# Patient Record
Sex: Female | Born: 1985 | Race: White | Hispanic: No | Marital: Married | Smoking: Never smoker
Health system: Southern US, Community
[De-identification: ages and names within clinical notes are randomized; demographics above are authoritative.]

## PROBLEM LIST (undated history)

## (undated) DIAGNOSIS — E119 Type 2 diabetes mellitus without complications: Secondary | ICD-10-CM

## (undated) DIAGNOSIS — R87619 Unspecified abnormal cytological findings in specimens from cervix uteri: Secondary | ICD-10-CM

## (undated) DIAGNOSIS — F32A Depression, unspecified: Secondary | ICD-10-CM

## (undated) DIAGNOSIS — F319 Bipolar disorder, unspecified: Secondary | ICD-10-CM

## (undated) DIAGNOSIS — F419 Anxiety disorder, unspecified: Secondary | ICD-10-CM

## (undated) DIAGNOSIS — F988 Other specified behavioral and emotional disorders with onset usually occurring in childhood and adolescence: Secondary | ICD-10-CM

## (undated) DIAGNOSIS — F320 Major depressive disorder, single episode, mild: Secondary | ICD-10-CM

## (undated) HISTORY — DX: Anxiety disorder, unspecified: F41.9

## (undated) HISTORY — DX: Type 2 diabetes mellitus without complications: E11.9

## (undated) HISTORY — DX: Depression, unspecified: F32.A

## (undated) HISTORY — DX: Bipolar disorder, unspecified: F31.9

## (undated) HISTORY — DX: Major depressive disorder, single episode, mild: F32.0

## (undated) HISTORY — PX: COLONOSCOPY: SHX174

## (undated) HISTORY — DX: Other specified behavioral and emotional disorders with onset usually occurring in childhood and adolescence: F98.8

## (undated) HISTORY — DX: Unspecified abnormal cytological findings in specimens from cervix uteri: R87.619

## (undated) HISTORY — PX: WISDOM TOOTH EXTRACTION: SHX21

---

## 2006-01-07 LAB — CONVERTED CEMR LAB: Pap Smear: NORMAL

## 2006-02-28 ENCOUNTER — Encounter: Payer: Self-pay | Admitting: Family Medicine

## 2006-12-25 ENCOUNTER — Ambulatory Visit: Payer: Self-pay | Admitting: Family Medicine

## 2006-12-25 DIAGNOSIS — E348 Other specified endocrine disorders: Secondary | ICD-10-CM | POA: Insufficient documentation

## 2006-12-25 DIAGNOSIS — E049 Nontoxic goiter, unspecified: Secondary | ICD-10-CM | POA: Insufficient documentation

## 2006-12-25 DIAGNOSIS — R079 Chest pain, unspecified: Secondary | ICD-10-CM | POA: Insufficient documentation

## 2006-12-25 DIAGNOSIS — G43009 Migraine without aura, not intractable, without status migrainosus: Secondary | ICD-10-CM | POA: Insufficient documentation

## 2006-12-25 DIAGNOSIS — J309 Allergic rhinitis, unspecified: Secondary | ICD-10-CM | POA: Insufficient documentation

## 2006-12-25 DIAGNOSIS — E282 Polycystic ovarian syndrome: Secondary | ICD-10-CM | POA: Insufficient documentation

## 2006-12-25 DIAGNOSIS — F319 Bipolar disorder, unspecified: Secondary | ICD-10-CM | POA: Insufficient documentation

## 2006-12-25 DIAGNOSIS — F411 Generalized anxiety disorder: Secondary | ICD-10-CM | POA: Insufficient documentation

## 2006-12-25 LAB — CONVERTED CEMR LAB: TSH: 1.97 microintl units/mL (ref 0.35–5.50)

## 2007-01-11 ENCOUNTER — Ambulatory Visit: Payer: Self-pay | Admitting: Family Medicine

## 2007-03-26 ENCOUNTER — Encounter: Payer: Self-pay | Admitting: Maternal & Fetal Medicine

## 2007-04-12 ENCOUNTER — Ambulatory Visit: Payer: Self-pay | Admitting: Obstetrics and Gynecology

## 2007-06-18 ENCOUNTER — Encounter: Payer: Self-pay | Admitting: Maternal & Fetal Medicine

## 2007-07-31 ENCOUNTER — Observation Stay: Payer: Self-pay | Admitting: Obstetrics and Gynecology

## 2007-08-05 ENCOUNTER — Inpatient Hospital Stay: Payer: Self-pay | Admitting: Obstetrics and Gynecology

## 2008-04-25 ENCOUNTER — Emergency Department: Payer: Self-pay | Admitting: Emergency Medicine

## 2010-04-17 IMAGING — US ULTRAOUND OB LIMITED - NRPT [facility]
1 series · 14 of 28 positions shown · non-contrast
Comparison: none

[Series 1: ultraound ob limited - nrpt [facility] · 0.29mm/px · 14 of 43 slices shown]
[im 2/43]
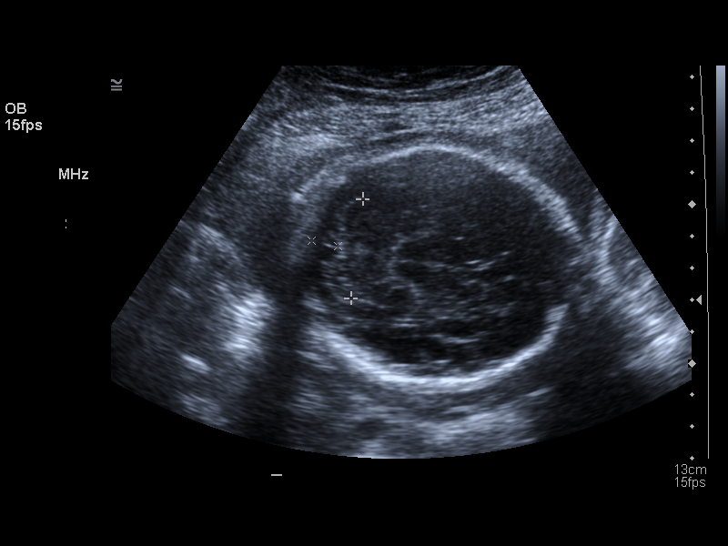
[im 5/43]
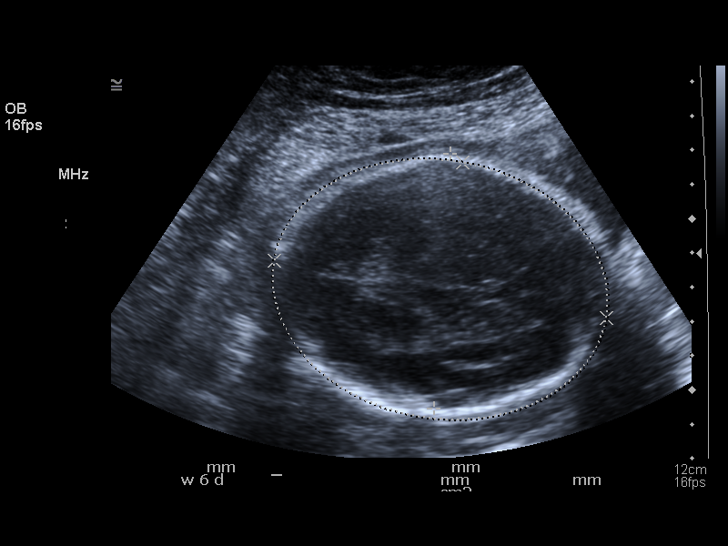
[im 8/43]
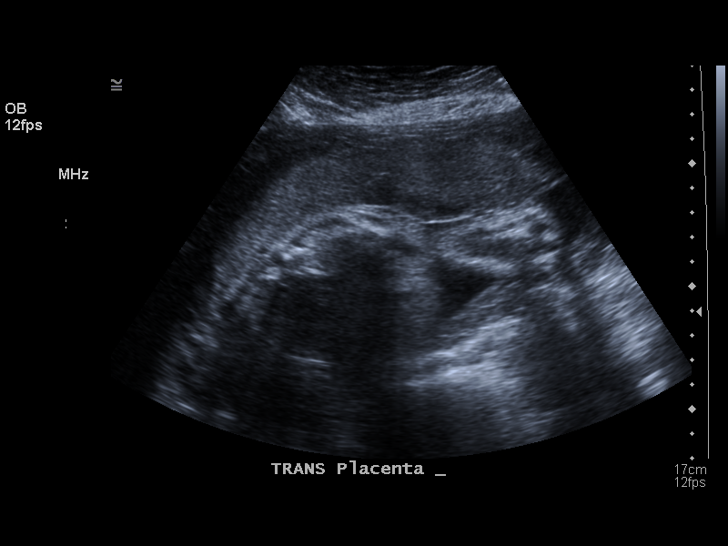
[im 11/43]
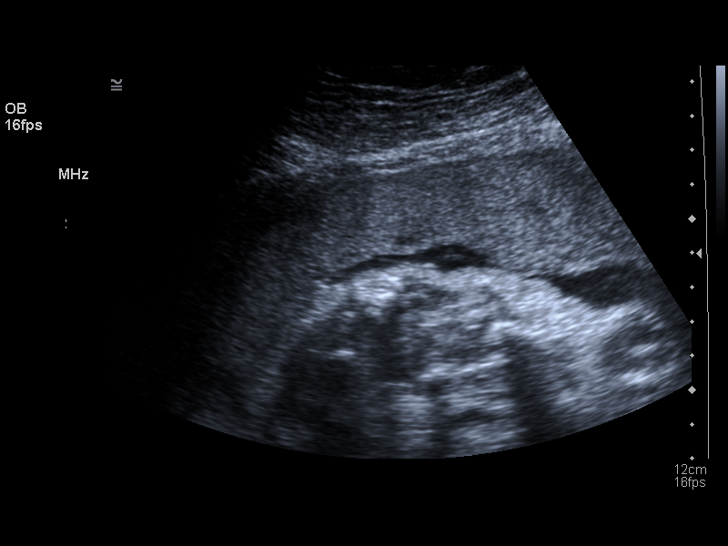
[im 15/43]
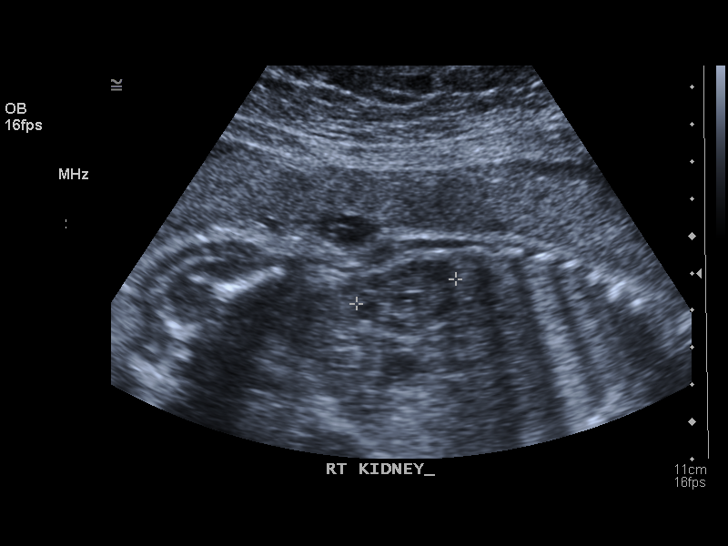
[im 18/43]
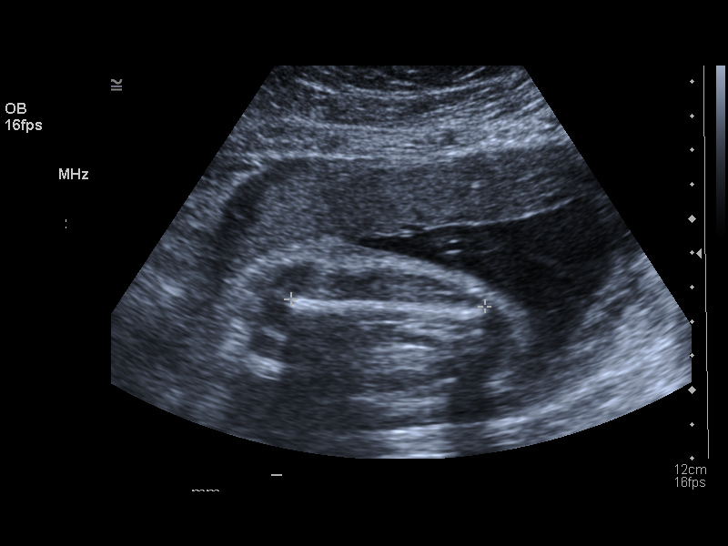
[im 21/43]
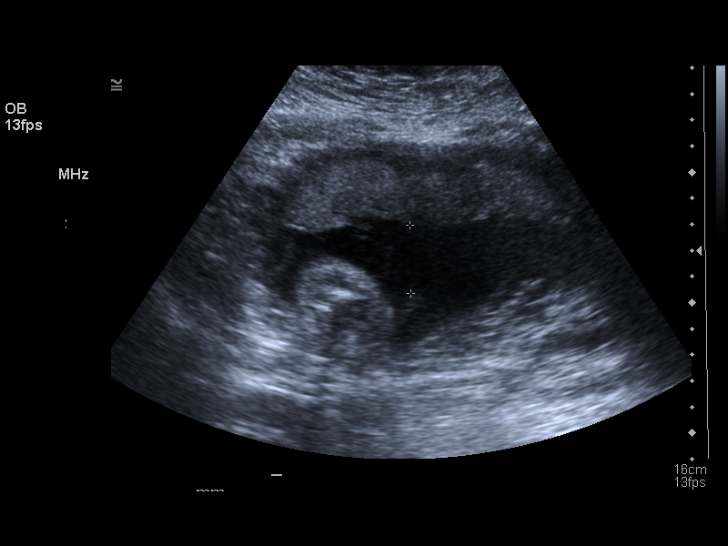
[im 24/43]
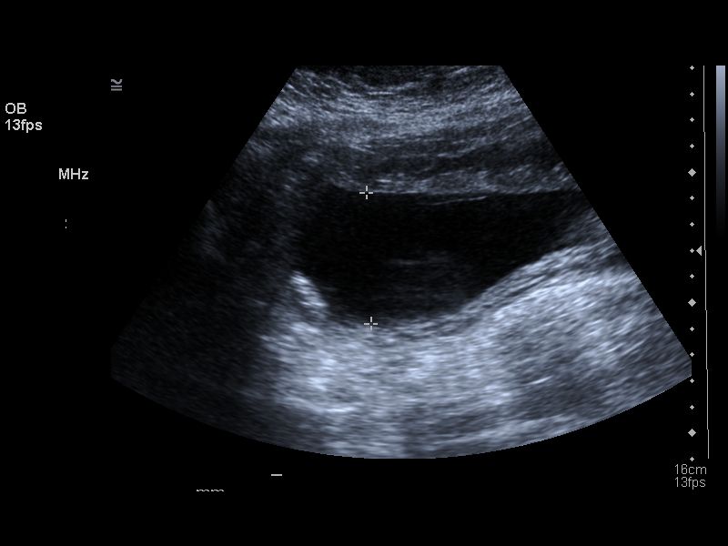
[im 27/43]
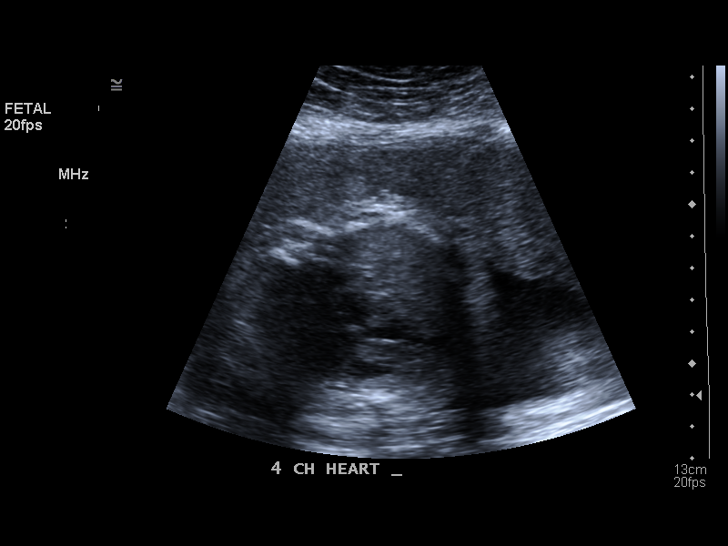
[im 30/43]
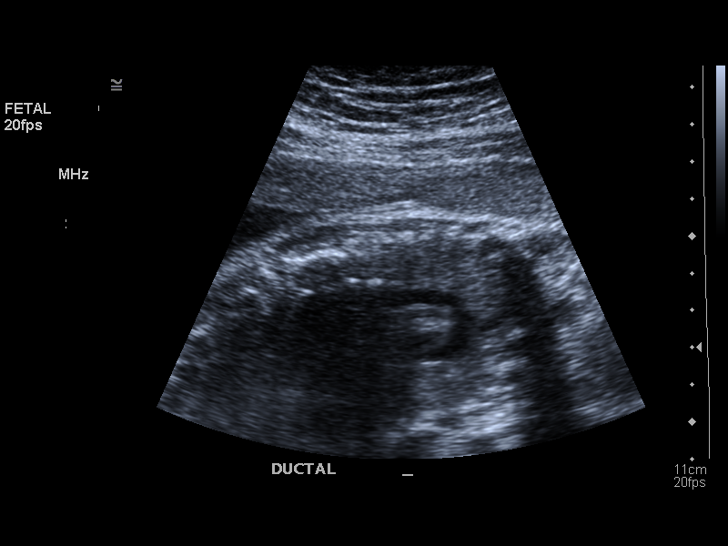
[im 33/43]
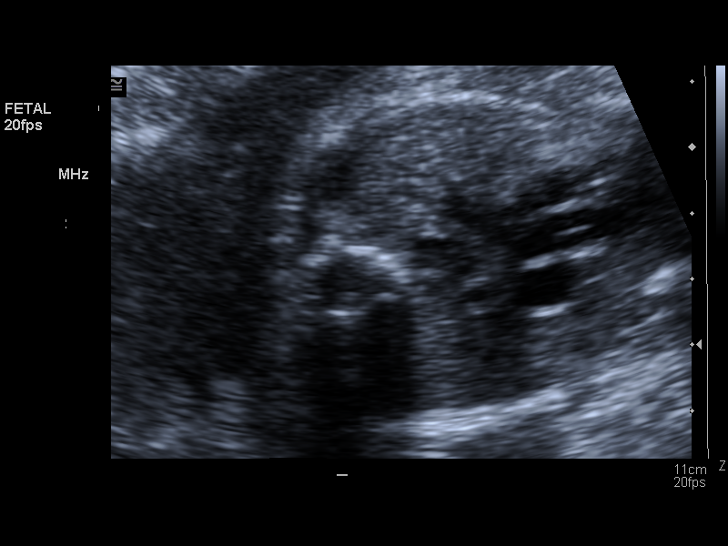
[im 36/43]
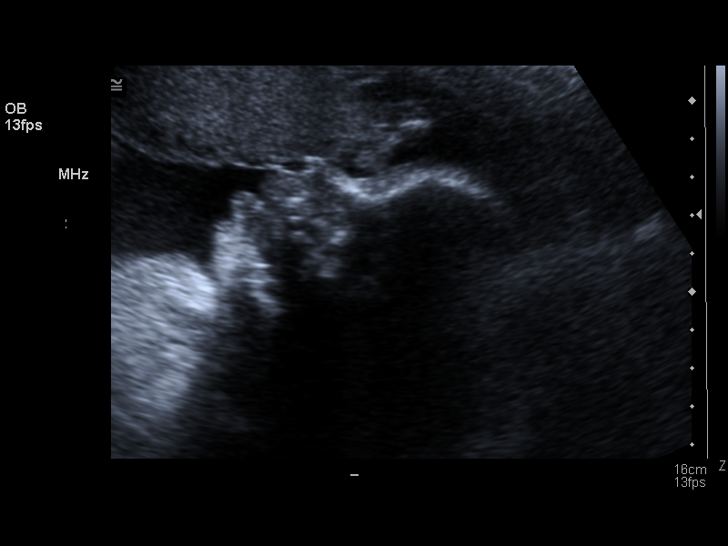
[im 39/43]
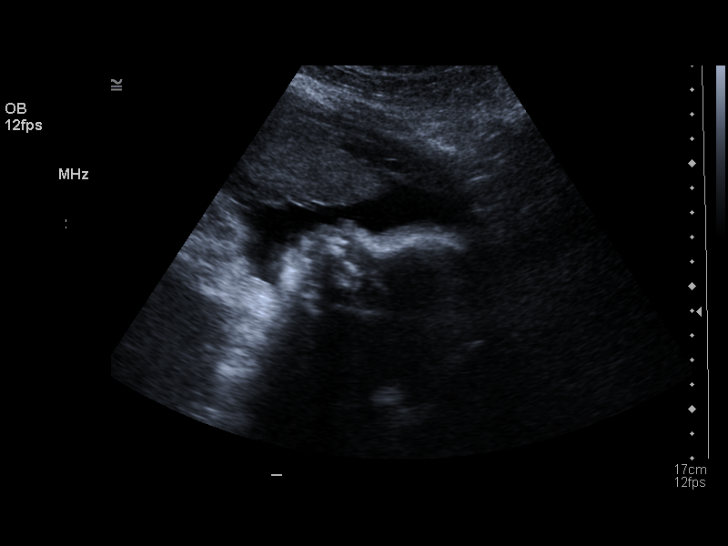
[im 43/43]
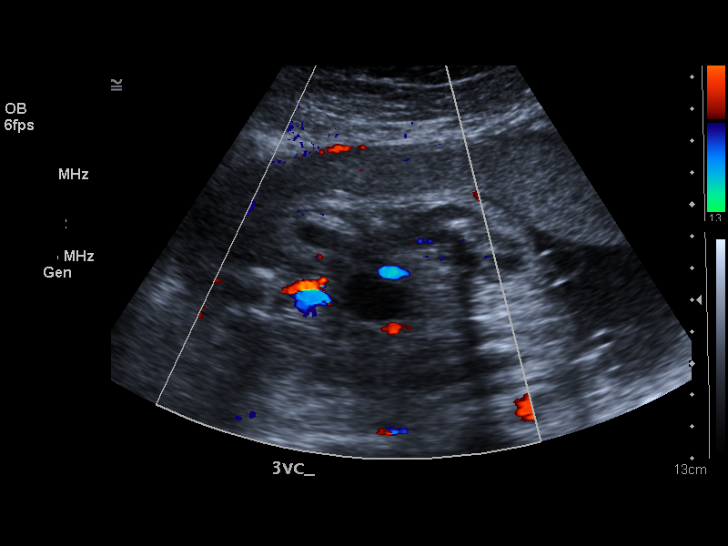

[14 of 28 positions shown; findings below may reference images not displayed]

IMAGES IMPORTED FROM THE [employer] WORKFLOW SYSTEM
NO DICTATION FOR STUDY

## 2010-06-07 IMAGING — US US OB FOLLOW-UP
1 series · 13 of 23 positions shown · non-contrast
Comparison: none

REASON FOR EXAM: fetal weight; amniotic fluid index
COMMENTS:

[Series 1: us ob follow-up · 0.35mm/px · 13 of 23 slices shown]
[im 1/23]
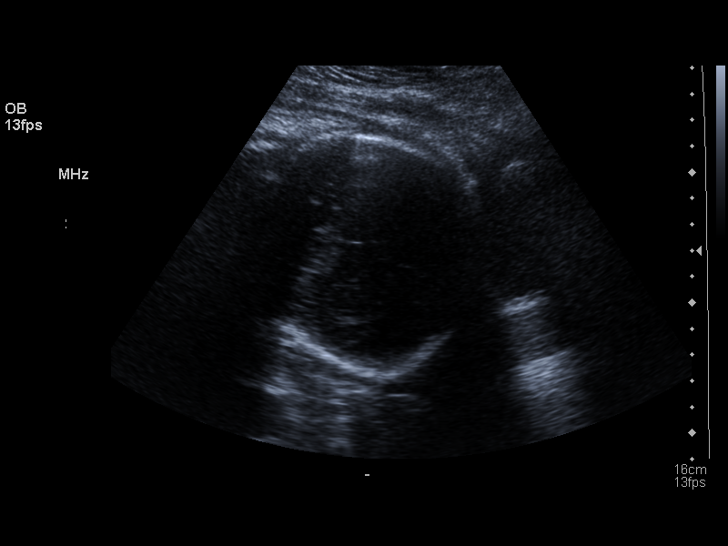
[im 3/23]
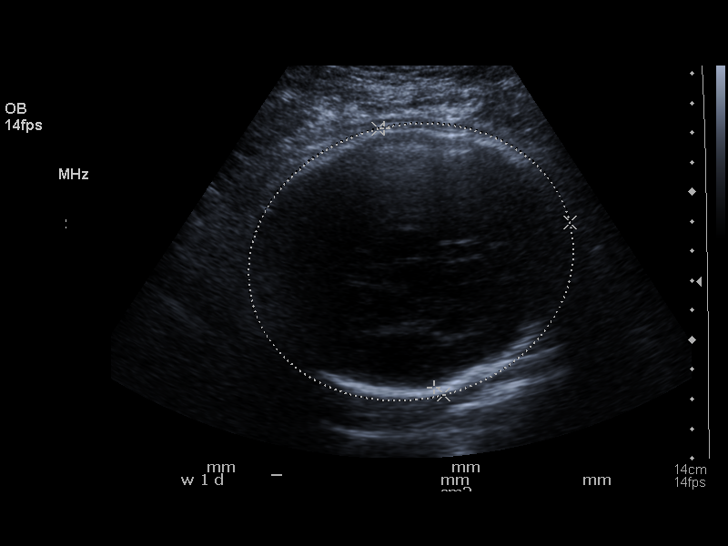
[im 5/23]
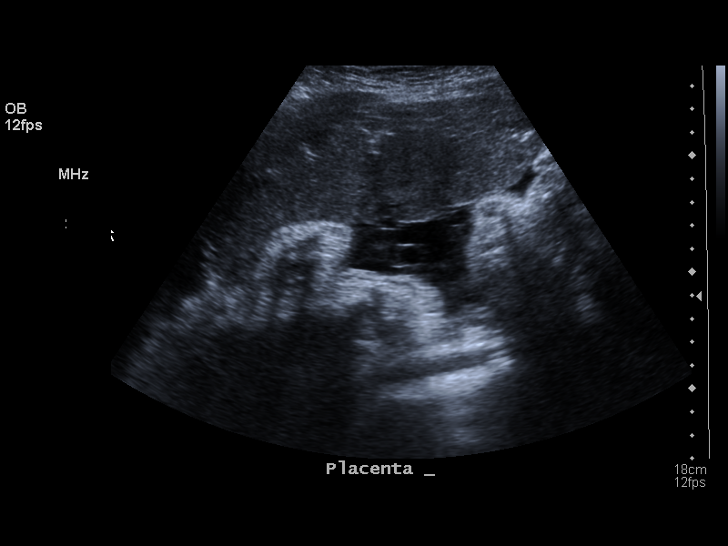
[im 7/23]
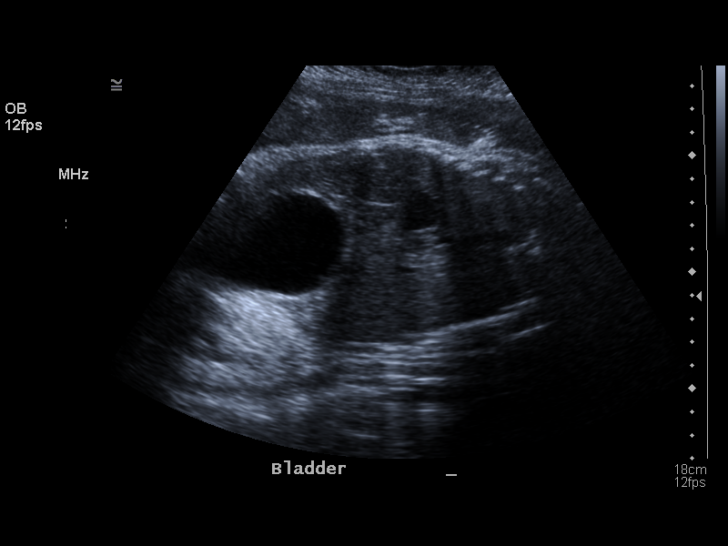
[im 8/23]
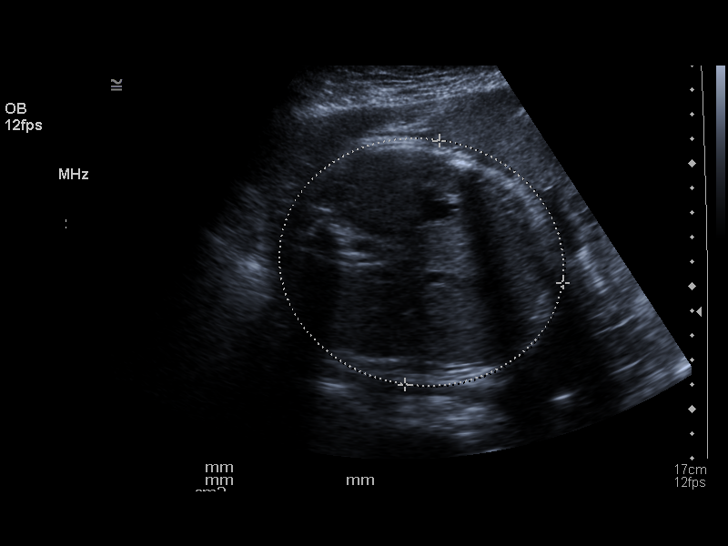
[im 10/23]
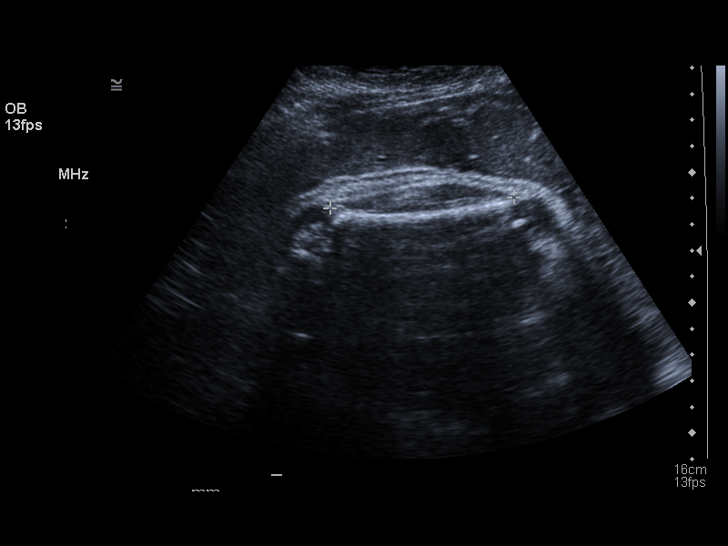
[im 12/23]
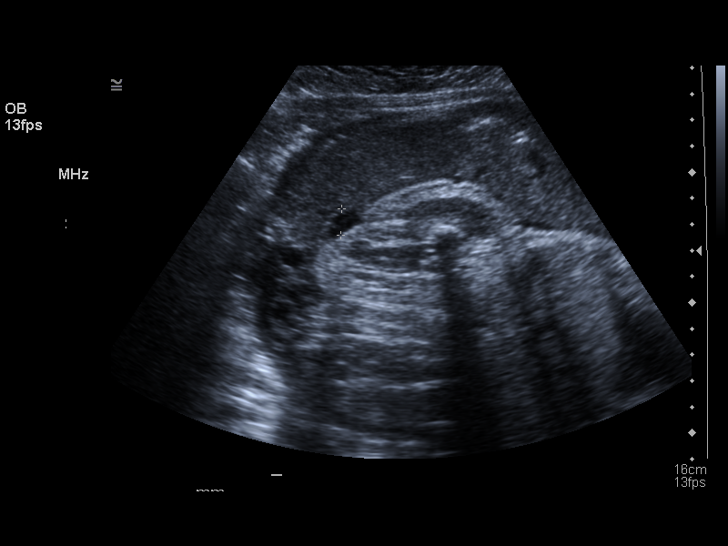
[im 14/23]
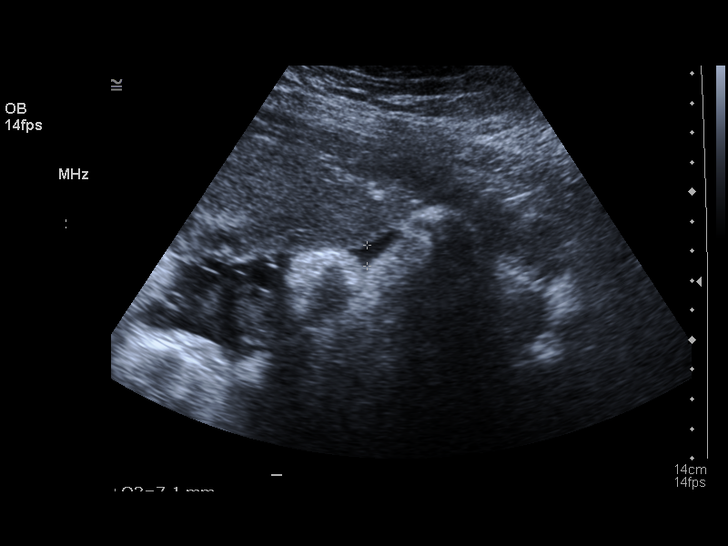
[im 16/23]
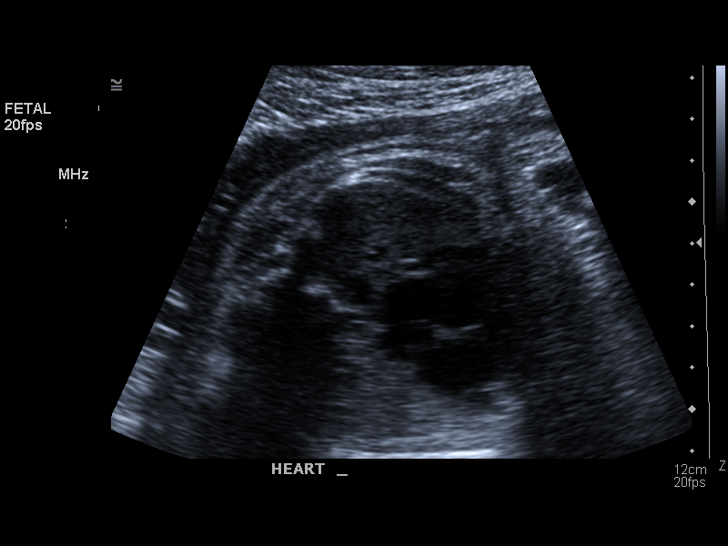
[im 17/23]
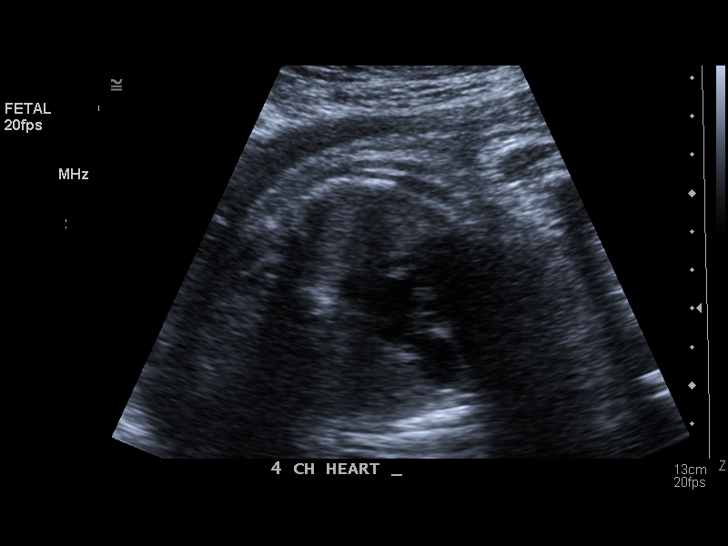
[im 19/23]
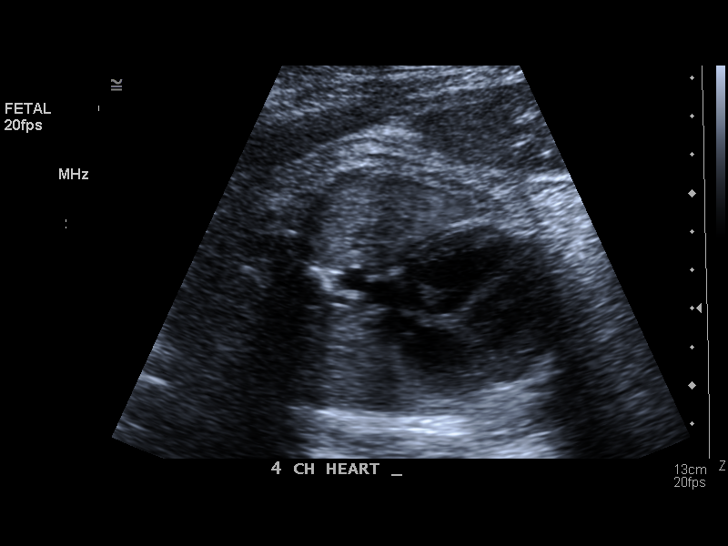
[im 21/23]
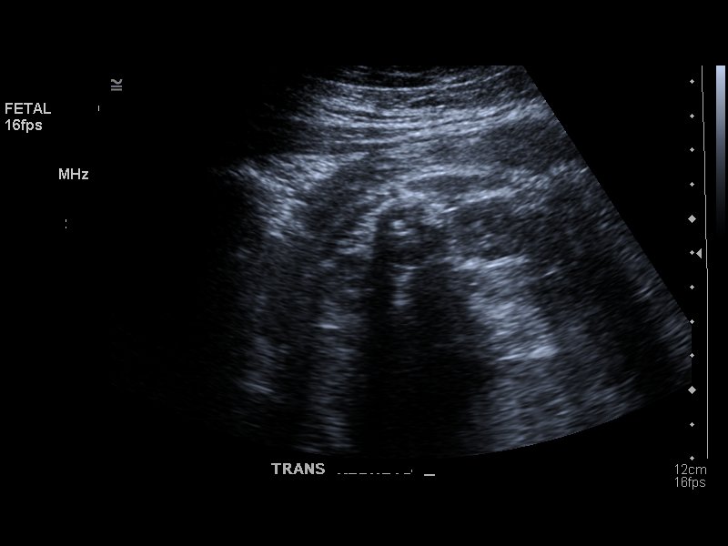
[im 23/23]
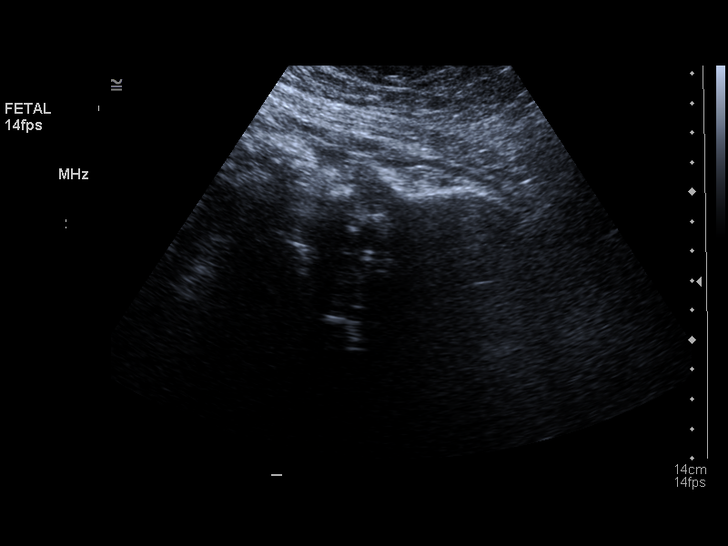

[13 of 23 positions shown; findings below may reference images not displayed]

PROCEDURE:     US  - US OB FOLLOW UP  - August 08, 2007  [DATE]

RESULT:     There is a viable IUP with a cephalic presentation. The placenta
is fundal and LEFT-sided and partially anterior. The amniotic fluid volume
is decreased with the index measured at 3.4 cm which is below the 7.0th
percentile. Cardiac activity with a rate of 128 beats per minute was
demonstrated.

The fetal kidneys and urinary bladder were demonstrated and appeared normal.
The intracranial structures and the stomach appeared normal. The spinal
structures have been evaluated on previous exam.

Measured parameters:

BPD    88.8 mm    Corresponding to an EGA of 35 weeks 6 days
HC      317.4 mm  Corresponding to an EGA of  35 weeks 5 days
AC        334 mm   Corresponding to an EGA of 37 weeks 2 days
FL          70.9 mm   Corresponding to an EGA of  36 weeks 2 days

The estimated fetal weight is 8802 grams + / - 406 grams.
IMPRESSION: There is a viable IUP with evidence of oligohydramnios. The estimated
gestational age is 36 weeks 2 days  + / - approximately 22 days. The estimated
date of confinement is 03 September, 2007. The fetal weight is 8802 grams + / - 446
grams.

## 2011-01-03 ENCOUNTER — Ambulatory Visit: Payer: Self-pay | Admitting: Otolaryngology

## 2011-01-19 ENCOUNTER — Ambulatory Visit: Payer: Self-pay | Admitting: Otolaryngology

## 2011-09-12 ENCOUNTER — Observation Stay: Payer: Self-pay

## 2011-09-12 LAB — URINALYSIS, COMPLETE
Bilirubin,UR: NEGATIVE
Leukocyte Esterase: NEGATIVE
Ph: 6 (ref 4.5–8.0)
Protein: NEGATIVE
RBC,UR: <1 /HPF (ref 0–5)
WBC UR: 1 /HPF (ref 0–5)

## 2011-09-17 ENCOUNTER — Observation Stay: Payer: Self-pay | Admitting: Obstetrics and Gynecology

## 2011-09-17 LAB — URINALYSIS, COMPLETE
Bacteria: NONE SEEN
Bilirubin,UR: NEGATIVE
Ph: 6 (ref 4.5–8.0)
Protein: NEGATIVE
RBC,UR: <1 /HPF (ref 0–5)
Specific Gravity: 1.02 (ref 1.003–1.030)
WBC UR: 1 /HPF (ref 0–5)

## 2011-09-23 ENCOUNTER — Observation Stay: Payer: Self-pay | Admitting: Obstetrics and Gynecology

## 2011-09-24 ENCOUNTER — Inpatient Hospital Stay: Payer: Self-pay | Admitting: Obstetrics and Gynecology

## 2011-09-24 LAB — CBC WITH DIFFERENTIAL/PLATELET
Basophil %: 1 %
Eosinophil #: 0 10*3/uL (ref 0.0–0.7)
Eosinophil %: 0.1 %
HCT: 33.9 % — ABNORMAL LOW (ref 35.0–47.0)
HGB: 11.5 g/dL — ABNORMAL LOW (ref 12.0–16.0)
MCH: 29.8 pg (ref 26.0–34.0)
MCHC: 33.8 g/dL (ref 32.0–36.0)
MCV: 88 fL (ref 80–100)
Monocyte #: 0.7 x10 3/mm (ref 0.2–0.9)
Neutrophil #: 12.2 10*3/uL — ABNORMAL HIGH (ref 1.4–6.5)
RBC: 3.85 10*6/uL (ref 3.80–5.20)

## 2011-09-25 LAB — HEMATOCRIT: HCT: 33 % — ABNORMAL LOW (ref 35.0–47.0)

## 2011-10-21 ENCOUNTER — Ambulatory Visit: Payer: Self-pay | Admitting: Pediatrics

## 2011-12-02 HISTORY — PX: INTRAUTERINE DEVICE INSERTION: SHX323

## 2013-08-23 ENCOUNTER — Emergency Department: Payer: Self-pay | Admitting: Emergency Medicine

## 2013-08-23 LAB — CBC WITH DIFFERENTIAL/PLATELET
BASOS ABS: 0 10*3/uL (ref 0.0–0.1)
Basophil %: 0.4 %
EOS ABS: 0.1 10*3/uL (ref 0.0–0.7)
EOS PCT: 2.4 %
HCT: 40.4 % (ref 35.0–47.0)
HGB: 13.2 g/dL (ref 12.0–16.0)
Lymphocyte #: 2.1 10*3/uL (ref 1.0–3.6)
Lymphocyte %: 38.1 %
MCH: 29.8 pg (ref 26.0–34.0)
MCHC: 32.7 g/dL (ref 32.0–36.0)
MCV: 91 fL (ref 80–100)
MONOS PCT: 6.9 %
Monocyte #: 0.4 x10 3/mm (ref 0.2–0.9)
NEUTROS ABS: 2.9 10*3/uL (ref 1.4–6.5)
NEUTROS PCT: 52.2 %
PLATELETS: 301 10*3/uL (ref 150–440)
RBC: 4.44 10*6/uL (ref 3.80–5.20)
RDW: 12.2 % (ref 11.5–14.5)
WBC: 5.5 10*3/uL (ref 3.6–11.0)

## 2013-08-23 LAB — COMPREHENSIVE METABOLIC PANEL
ALT: 31 U/L (ref 12–78)
ANION GAP: 3 — AB (ref 7–16)
Albumin: 4 g/dL (ref 3.4–5.0)
Alkaline Phosphatase: 76 U/L
BILIRUBIN TOTAL: 0.3 mg/dL (ref 0.2–1.0)
BUN: 17 mg/dL (ref 7–18)
CALCIUM: 8.3 mg/dL — AB (ref 8.5–10.1)
CHLORIDE: 111 mmol/L — AB (ref 98–107)
CO2: 27 mmol/L (ref 21–32)
CREATININE: 0.77 mg/dL (ref 0.60–1.30)
EGFR (Non-African Amer.): >60
GLUCOSE: 91 mg/dL (ref 65–99)
OSMOLALITY: 282 (ref 275–301)
Potassium: 3.9 mmol/L (ref 3.5–5.1)
SGOT(AST): 25 U/L (ref 15–37)
Sodium: 141 mmol/L (ref 136–145)
Total Protein: 7.3 g/dL (ref 6.4–8.2)

## 2013-08-23 LAB — URINALYSIS, COMPLETE
BLOOD: NEGATIVE
Bacteria: NONE SEEN
Bilirubin,UR: NEGATIVE
Glucose,UR: NEGATIVE mg/dL (ref 0–75)
KETONE: NEGATIVE
NITRITE: NEGATIVE
PROTEIN: NEGATIVE
Ph: 6 (ref 4.5–8.0)
Specific Gravity: 1.018 (ref 1.003–1.030)
WBC UR: 4 /HPF (ref 0–5)

## 2013-08-23 LAB — LIPASE, BLOOD: Lipase: 198 U/L (ref 73–393)

## 2014-06-17 NOTE — H&P (Signed)
L&D Evaluation:  History:   HPI 29 yo G3P1001 @ 35.6wks EDC 10/16/11 by 5wk uls presents with contractions.  They started last night.  She called and reported them as q 5 min and was told to come in for evaluation.  She has not had any cervical change since arrival.  [facility] ?type II DM for which she is not on any meds and 3hr glucola WNL, anxiety/depression.  Urine WNL except tr ketones.    Presents with contractions    Patient's Medical History Anxiety/depression, ?type II DM    Patient's Surgical History Wisdom teeth    Medications Zoloft    Allergies NKDA    Social History none    Family History Anxiety, Bipolar, HTN, Depression   ROS:   ROS see HPI   Exam:   Vital Signs stable    Urine Protein neg UA    General no apparent distress    Mental Status clear    Chest clear    Heart normal sinus rhythm    Abdomen gravid, non-tender    Edema no edema    Pelvic 3-4cm per RN    Mebranes Intact    FHT normal rate with no decels    Fetal Heart Rate 125    Ucx regular, q 2 min    Skin dry   Impression:   Impression Threatened PTL   Plan:   Plan EFM/NST, monitor contractions and for cervical change, fluids    Comments Consider terbutaline.   Electronic Signatures: Senaida LangeWeaver-Lee, Mimie Goering (MD)  (Signed 10-Aug-13 08:23)  Authored: L&D Evaluation   Last Updated: 10-Aug-13 08:23 by Senaida LangeWeaver-Lee, Naureen Benton (MD)

## 2014-06-17 NOTE — H&P (Signed)
L&D Evaluation:  History:   HPI 29 yo G3P1011 [facility]d [facility] by 5wk uls presents with contractions.  They started yesterday evening and have been increasing in intensity, noted some spotting, good fetal movement, no LOF.  She was seen 1 week ago for similar concerns and was noted to be 4cm dilated with no cervical change and was discharged home.  [facility] @ [facility] complicated by ?type II DM for which she is not on any meds and 3hr glucola WNL, anxiety/depression.    Presents with contractions    Patient's Medical History Anxiety/depression, ?type II DM    Patient's Surgical History Wisdom teeth    Medications Zoloft    Allergies NKDA    Social History none    Family History Anxiety, Bipolar, HTN, Depression   ROS:   ROS see HPI   Exam:   Vital Signs stable    Urine Protein not completed    General no apparent distress    Mental Status clear    Chest clear    Heart normal sinus rhythm    Abdomen gravid, non-tender    Estimated Fetal Weight Average for gestational age    Fetal Position vtx    Edema no edema    Pelvic no external lesions, 4/70/-3    Mebranes Intact    FHT normal rate with no decels    Fetal Heart Rate 125    Ucx regular, q 2-5 min    Skin dry   Impression:   Impression R/O labor   Plan:   Plan EFM/NST, monitor contractions and for cervical change, fluids    Comments - po fluids - recheck in 2-hrs   Electronic Signatures: Lorrene ReidStaebler, Shron Ozer M (MD)  (Signed 17-Aug-13 02:11)  Authored: L&D Evaluation   Last Updated: 17-Aug-13 02:11 by Lorrene ReidStaebler, Elanna Bert M (MD)

## 2014-06-17 NOTE — H&P (Signed)
L&D Evaluation:  History:   HPI 29 yo G3P1001 [facility]d [facility] by 5wk uls represents with contractions.  They started yesterday evening and have been increasing in intensity, noted some spotting, good fetal movement, no LOF.  She was seen earlier today for similar symptoms, was noted to be unchanged over 3hrs and was given option of discharge home vs staying for morphine sleep.  The patient was given two norco tabs and discharged home.  She represents with continued contractions.    Presents with contractions    Patient's Medical History Anxiety/depression, ?type II DM    Patient's Surgical History Wisdom teeth    Medications Zoloft    Allergies PCN    Social History none    Family History Anxiety, Bipolar, HTN, Depression   ROS:   ROS see HPI   Exam:   Vital Signs stable    Urine Protein not completed    General no apparent distress    Mental Status clear    Chest clear    Heart normal sinus rhythm    Abdomen gravid, non-tender    Estimated Fetal Weight Average for gestational age    Fetal Position vtx    Edema no edema    Pelvic no external lesions, 4/70/-3    Mebranes Intact    FHT normal rate with no decels    Fetal Heart Rate 125    Ucx regular, q 5 min    Skin dry   Impression:   Impression R/O labor   Plan:   Plan EFM/NST, monitor contractions and for cervical change, fluids    Comments - Cervix unchanged, reassuring fetal survillance on NST - Will morphine sleep  and IV hydrate overnight and recheck in AM, discussed if no cervical change will discharge home unable to induce prior to 39 weeks.   Electronic Signatures: Lorrene ReidStaebler, Trevone Prestwood M (MD)  (Signed [phone]-Aug-13 20:56)  Authored: L&D Evaluation   Last Updated: 16-Aug-13 20:56 by Lorrene ReidStaebler, Bernis Stecher M (MD)

## 2016-02-29 HISTORY — PX: IUD REMOVAL: SHX5392

## 2017-04-20 ENCOUNTER — Encounter: Payer: Self-pay | Admitting: Obstetrics and Gynecology

## 2017-04-21 ENCOUNTER — Ambulatory Visit: Payer: Self-pay | Admitting: Obstetrics and Gynecology

## 2017-04-28 ENCOUNTER — Ambulatory Visit (INDEPENDENT_AMBULATORY_CARE_PROVIDER_SITE_OTHER): Payer: BLUE CROSS/BLUE SHIELD | Admitting: Obstetrics and Gynecology

## 2017-04-28 ENCOUNTER — Encounter: Payer: Self-pay | Admitting: Obstetrics and Gynecology

## 2017-04-28 DIAGNOSIS — Z1231 Encounter for screening mammogram for malignant neoplasm of breast: Secondary | ICD-10-CM

## 2017-04-28 DIAGNOSIS — Z1239 Encounter for other screening for malignant neoplasm of breast: Secondary | ICD-10-CM

## 2017-04-28 DIAGNOSIS — Z01419 Encounter for gynecological examination (general) (routine) without abnormal findings: Secondary | ICD-10-CM

## 2017-04-28 DIAGNOSIS — N946 Dysmenorrhea, unspecified: Secondary | ICD-10-CM

## 2017-04-28 MED ORDER — MEFENAMIC ACID 250 MG PO CAPS: ORAL_CAPSULE | ORAL | 13 refills | Status: DC

## 2017-04-28 NOTE — Patient Instructions (Signed)
Karie Mainland or [employer]   FAST FACTS . Body Mass Index (BMI) is one measurement that your doctor may use to discuss your weight . BMI is an estimate of body fat. Individuals with a BMI of 25.0-29.9 are considered overweight. Those with a BMI above 30.0 are considered obese . Obesity is a risk factor for many cancers, especially endometrial cancer. In fact, if you are obese, your risk for endometrial cancer may be 10 times higher. . Obesity may affect how your cancer is treated (surgery, chemotherapy, and/or radiation). . If you are overweight or obese, ask your doctor for information about diet and exercise programs.  EXERCISE Here is a list of resources for exercise recommendations and programs that can help you get started. Be sure to look for exercise programs and classes in your neighborhood to get personal support.  American Cancer Society (ACS): Eat Healthy and Get Active www.cancer.org/healthy/eathealthygetactive/ The site provides details about the importance of exercise in cancer prevention as well as resources providing exercise guidelines and tools to set goals and manage physical activity  American Council on Exercise (ACE): Get Fit www.acefitness.org/acefit  This site is full of fitness programs including personalized training workouts and a Engineering geologist of exercise programs. Links to local exercise trainers are provided.  American Heart Association: Getting Healthy - Physical Activity [id number] This site provides the American Heart Association guidelines for physical activity, tips for getting started and tips for long term success.   Calorie Counting for Weight Loss Calories are units of energy. Your body needs a certain amount of calories from food to keep you going throughout the day. When you eat more calories than your body needs, your body stores the extra calories as fat. When you eat fewer  calories than your body needs, your body burns fat to get the energy it needs. Calorie counting means keeping track of how many calories you eat and drink each day. Calorie counting can be helpful if you need to lose weight. If you make sure to eat fewer calories than your body needs, you should lose weight. Ask your health care provider what a healthy weight is for you. For calorie counting to work, you will need to eat the right number of calories in a day in order to lose a healthy amount of weight per week. A dietitian can help you determine how many calories you need in a day and will give you suggestions on how to reach your calorie goal.  A healthy amount of weight to lose per week is usually 1-2 lb (0.5-0.9 kg). This usually means that your daily calorie intake should be reduced by 500-750 calories.  Eating 1,200 - 1,500 calories per day can help most women lose weight.  Eating 1,500 - 1,800 calories per day can help most men lose weight.  What is my plan? My goal is to have __________ calories per day. If I have this many calories per day, I should lose around __________ pounds per week. What do I need to know about calorie counting? In order to meet your daily calorie goal, you will need to:  Find out how many calories are in each food you would like to eat. Try to do this before you eat.  Decide how much of the food you plan to eat.  Write down what you ate and how many calories it had. Doing this is called keeping a food log.  To successfully lose weight, it is important to balance calorie counting with a healthy lifestyle  that includes regular activity. Aim for 150 minutes of moderate exercise (such as walking) or 75 minutes of vigorous exercise (such as running) each week. Where do I find calorie information?  The number of calories in a food can be found on a Nutrition Facts label. If a food does not have a Nutrition Facts label, try to look up the calories online or ask your  dietitian for help. Remember that calories are listed per serving. If you choose to have more than one serving of a food, you will have to multiply the calories per serving by the amount of servings you plan to eat. For example, the label on a package of bread might say that a serving size is 1 slice and that there are 90 calories in a serving. If you eat 1 slice, you will have eaten 90 calories. If you eat 2 slices, you will have eaten 180 calories. How do I keep a food log? Immediately after each meal, record the following information in your food log:  What you ate. Don't forget to include toppings, sauces, and other extras on the food.  How much you ate. This can be measured in cups, ounces, or number of items.  How many calories each food and drink had.  The total number of calories in the meal.  Keep your food log near you, such as in a small notebook in your pocket, or use a mobile app or website. Some programs will calculate calories for you and show you how many calories you have left for the day to meet your goal. What are some calorie counting tips?  Use your calories on foods and drinks that will fill you up and not leave you hungry: ? Some examples of foods that fill you up are nuts and nut butters, vegetables, lean proteins, and high-fiber foods like whole grains. High-fiber foods are foods with more than 5 g fiber per serving. ? Drinks such as sodas, specialty coffee drinks, alcohol, and juices have a lot of calories, yet do not fill you up.  Eat nutritious foods and avoid empty calories. Empty calories are calories you get from foods or beverages that do not have many vitamins or protein, such as candy, sweets, and soda. It is better to have a nutritious high-calorie food (such as an avocado) than a food with few nutrients (such as a bag of chips).  Know how many calories are in the foods you eat most often. This will help you calculate calorie counts faster.  Pay attention to  calories in drinks. Low-calorie drinks include water and unsweetened drinks.  Pay attention to nutrition labels for "low fat" or "fat free" foods. These foods sometimes have the same amount of calories or more calories than the full fat versions. They also often have added sugar, starch, or salt, to make up for flavor that was removed with the fat.  Find a way of tracking calories that works for you. Get creative. Try different apps or programs if writing down calories does not work for you. What are some portion control tips?  Know how many calories are in a serving. This will help you know how many servings of a certain food you can have.  Use a measuring cup to measure serving sizes. You could also try weighing out portions on a kitchen scale. With time, you will be able to estimate serving sizes for some foods.  Take some time to put servings of different foods on your favorite plates,  bowls, and cups so you know what a serving looks like.  Try not to eat straight from a bag or box. Doing this can lead to overeating. Put the amount you would like to eat in a cup or on a plate to make sure you are eating the right portion.  Use smaller plates, glasses, and bowls to prevent overeating.  Try not to multitask (for example, watch TV or use your computer) while eating. If it is time to eat, sit down at a table and enjoy your food. This will help you to know when you are full. It will also help you to be aware of what you are eating and how much you are eating. What are tips for following this plan? Reading food labels  Check the calorie count compared to the serving size. The serving size may be smaller than what you are used to eating.  Check the source of the calories. Make sure the food you are eating is high in vitamins and protein and low in saturated and trans fats. Shopping  Read nutrition labels while you shop. This will help you make healthy decisions before you decide to purchase  your food.  Make a grocery list and stick to it. Cooking  Try to cook your favorite foods in a healthier way. For example, try baking instead of frying.  Use low-fat dairy products. Meal planning  Use more fruits and vegetables. Half of your plate should be fruits and vegetables.  Include lean proteins like poultry and fish. How do I count calories when eating out?  Ask for smaller portion sizes.  Consider sharing an entree and sides instead of getting your own entree.  If you get your own entree, eat only half. Ask for a box at the beginning of your meal and put the rest of your entree in it so you are not tempted to eat it.  If calories are listed on the menu, choose the lower calorie options.  Choose dishes that include vegetables, fruits, whole grains, low-fat dairy products, and lean protein.  Choose items that are boiled, broiled, grilled, or steamed. Stay away from items that are buttered, battered, fried, or served with cream sauce. Items labeled "crispy" are usually fried, unless stated otherwise.  Choose water, low-fat milk, unsweetened iced tea, or other drinks without added sugar. If you want an alcoholic beverage, choose a lower calorie option such as a glass of wine or light beer.  Ask for dressings, sauces, and syrups on the side. These are usually high in calories, so you should limit the amount you eat.  If you want a salad, choose a garden salad and ask for grilled meats. Avoid extra toppings like bacon, cheese, or fried items. Ask for the dressing on the side, or ask for olive oil and vinegar or lemon to use as dressing.  Estimate how many servings of a food you are given. For example, a serving of cooked rice is  cup or about the size of half a baseball. Knowing serving sizes will help you be aware of how much food you are eating at restaurants. The list below tells you how big or small some common portion sizes are based on everyday objects: ? 1 oz-4 stacked  dice. ? 3 oz-1 deck of cards. ? 1 tsp-1 die. ? 1 Tbsp- a ping-pong ball. ? 2 Tbsp-1 ping-pong ball. ?  cup- baseball. ? 1 cup-1 baseball. Summary  Calorie counting means keeping track of how many calories you  eat and drink each day. If you eat fewer calories than your body needs, you should lose weight.  A healthy amount of weight to lose per week is usually 1-2 lb (0.5-0.9 kg). This usually means reducing your daily calorie intake by 500-750 calories.  The number of calories in a food can be found on a Nutrition Facts label. If a food does not have a Nutrition Facts label, try to look up the calories online or ask your dietitian for help.  Use your calories on foods and drinks that will fill you up, and not on foods and drinks that will leave you hungry.  Use smaller plates, glasses, and bowls to prevent overeating. This information is not intended to replace advice given to you by your health care provider. Make sure you discuss any questions you have with your health care provider. Document Released: 01/24/2005 Document Revised: 12/25/2015 Document Reviewed: 12/25/2015 Elsevier Interactive Patient Education  [employer].

## 2017-04-28 NOTE — Progress Notes (Signed)
Patient ID: Marissa Holmes, female   DOB: Jan 07, 1986, 32 y.o.   MRN: [id number]       Gynecology Annual Exam   PCP: Excell Seltzer, MD  Chief Complaint:  Chief Complaint  Patient presents with  . Gynecologic Exam    weight gain on medication    History of Present Illness: Patient is a 32 y.o. [id number] presents for annual exam. The patient has no complaints today.   LMP: Patient's last menstrual period was 04/15/2017 (exact date). Average Interval: regular, 28 days Duration of flow: 5 days Heavy Menses: no Clots: no Intermenstrual Bleeding: no Postcoital Bleeding: no Dysmenorrhea: yes  The patient is sexually active. She currently uses condoms for contraception. She denies dyspareunia.  The patient does perform self breast exams.  There is no notable family history of breast or ovarian cancer in her family.  The patient wears seatbelts: yes.   The patient has regular exercise: yes.    The patient reports current symptoms of depression. Currently managed by psychiatry.  She was started on luteal phase Zoloft for PMDD.   Review of Systems: Review of Systems  Constitutional: Negative for chills and fever.  HENT: Negative for congestion.   Respiratory: Negative for cough and shortness of breath.   Cardiovascular: Negative for chest pain and palpitations.  Gastrointestinal: Negative for abdominal pain, constipation, diarrhea, heartburn, nausea and vomiting.  Genitourinary: Negative for dysuria, frequency and urgency.  Skin: Negative for itching and rash.  Neurological: Negative for dizziness and headaches.  Endo/Heme/Allergies: Negative for polydipsia.  Psychiatric/Behavioral: Negative for depression.    Past Medical History:  Past Medical History:  Diagnosis Date  . Abnormal Pap smear of cervix   . Anxiety   . Attention deficit disorder (ADD)   . Bipolar 1 disorder (HCC)   . Mild depression (HCC)   . Type 2 diabetes mellitus (HCC)     Past Surgical History:    Past Surgical History:  Procedure Laterality Date  . COLONOSCOPY    . INTRAUTERINE DEVICE INSERTION  12/02/2011  . IUD REMOVAL  02/29/2016  . WISDOM TOOTH EXTRACTION      Gynecologic History:  Patient's last menstrual period was 04/15/2017 (exact date). Contraception: condoms Last Pap: Results were:02/29/2016 NIL and HR HPV+   Obstetric History: [id number]  Family History:  Family History  Problem Relation Age of Onset  . Colon cancer Paternal Grandmother 59  . Hypertension Paternal Grandmother   . Anxiety disorder Mother   . Depression Mother   . Hypertension Father   . Depression Father   . Bipolar disorder Maternal Grandmother   . Depression Maternal Grandmother   . Melanoma Maternal Grandfather   . Prostate cancer Maternal Grandfather   . Melanoma Paternal Grandfather     Social History:  Social History   Socioeconomic History  . Marital status: Single    Spouse name: Not on file  . Number of children: Not on file  . Years of education: Not on file  . Highest education level: Not on file  Occupational History  . Not on file  Social Needs  . Financial resource strain: Not on file  . Food insecurity:    Worry: Not on file    Inability: Not on file  . Transportation needs:    Medical: Not on file    Non-medical: Not on file  Tobacco Use  . Smoking status: Never Smoker  . Smokeless tobacco: Never Used  Substance and Sexual Activity  . Alcohol use: Yes  Frequency: Never  . Drug use: Never  . Sexual activity: Yes  Lifestyle  . Physical activity:    Days per week: Not on file    Minutes per session: Not on file  . Stress: Not on file  Relationships  . Social connections:    Talks on phone: Not on file    Gets together: Not on file    Attends religious service: Not on file    Active member of club or organization: Not on file    Attends meetings of clubs or organizations: Not on file    Relationship status: Not on file  . Intimate partner violence:     Fear of current or ex partner: Not on file    Emotionally abused: Not on file    Physically abused: Not on file    Forced sexual activity: Not on file  Other Topics Concern  . Not on file  Social History Narrative  . Not on file    Allergies:  No Known Allergies  Medications: Prior to Admission medications   Medication Sig Start Date End Date Taking? Authorizing Provider  ALPRAZolam Prudy Feeler) 0.25 MG tablet  01/13/14  Yes [provider]  Carbamazepine (EQUETRO) 300 MG CP12  06/20/13  Yes [provider]  clonazePAM (KLONOPIN) 0.5 MG tablet  03/12/14  Yes [provider]  lamoTRIgine (LAMICTAL) 100 MG tablet  01/21/14  Yes [provider]  lisdexamfetamine (VYVANSE) 40 MG capsule Take by mouth.   Yes [provider]  Multiple Vitamin (THERA) TABS Take by mouth.   Yes [provider]  sertraline (ZOLOFT) 50 MG tablet TK 1 T PO QD 01/22/17  Yes [provider]  SUMAtriptan (IMITREX) 50 MG tablet TAKE 1 TABLET BY MOUTH AS NEEDED FOR MIGRAINE, MAY REPEAT DOSE AFTER 2 HOURS AS NEEDED 08/02/16  Yes [provider]  topiramate (TOPAMAX) 50 MG tablet TAKE 1 TABLET(50 MG) BY MOUTH EVERY DAY 05/31/16  Yes [provider]  traZODone (DESYREL) 150 MG tablet  03/21/17  Yes [provider]  Mefenamic Acid (PONSTEL) 250 MG CAPS 2 capsules (500 mg) po once and the onset of menses, then 1 capsule (250 mg) every 6 hours prn cramping max 3 days 04/28/17   Vena Austria, MD    Physical Exam Vitals: Blood pressure 124/72, pulse 82, height 5' 1.5" (1.562 m), weight 150 lb (68 kg), last menstrual period 04/15/2017.  General: NAD HEENT: normocephalic, anicteric Thyroid: no enlargement, no palpable nodules Pulmonary: No increased work of breathing, CTAB Cardiovascular: RRR, distal pulses 2+ Breast: Breast symmetrical, no tenderness, no palpable nodules or masses, no skin or nipple retraction present, no nipple  discharge.  No axillary or supraclavicular lymphadenopathy. Abdomen: NABS, soft, non-tender, non-distended.  Umbilicus without lesions.  No hepatomegaly, splenomegaly or masses palpable. No evidence of hernia  Genitourinary:  External: Normal external female genitalia.  Normal urethral meatus, normal Bartholin's and Skene's glands.    Vagina: Normal vaginal mucosa, no evidence of prolapse.    Cervix: Grossly normal in appearance, no bleeding  Uterus: Non-enlarged, mobile, normal contour.  No CMT  Adnexa: ovaries non-enlarged, no adnexal masses  Rectal: deferred  Lymphatic: no evidence of inguinal lymphadenopathy Extremities: no edema, erythema, or tenderness Neurologic: Grossly intact Psychiatric: mood appropriate, affect full  Female chaperone present for pelvic and breast  portions of the physical exam    Assessment: 32 y.o. [id number] routine annual exam  Plan: Problem List Items Addressed This Visit    None  Visit Diagnoses    Breast screening       Encounter for gynecological examination without abnormal finding       Dysmenorrhea          2) STI screening  was notoffered and therefore not obtained  2)  ASCCP guidelines and rational discussed.  Patient opts for every 3 years screening interval  3) Contraception - the patient is currently using  condoms.  She is happy with her current form of contraception and plans to continue  - add ponstel for dysmenorrhea  4) Routine healthcare maintenance including cholesterol, diabetes screening discussed managed by PCP  5) Return in about 1 year (around 04/29/2018) for annual.   Vena AustriaAndreas Tija Biss, MD, Merlinda FrederickFACOG [facility], [facility] 04/28/2017, 7:43 PM

## 2017-06-13 ENCOUNTER — Encounter: Payer: Self-pay | Admitting: Obstetrics and Gynecology

## 2017-07-18 ENCOUNTER — Encounter: Payer: Self-pay | Admitting: Obstetrics and Gynecology

## 2017-10-31 DIAGNOSIS — F411 Generalized anxiety disorder: Secondary | ICD-10-CM | POA: Insufficient documentation

## 2017-11-22 ENCOUNTER — Other Ambulatory Visit: Payer: Self-pay | Admitting: Psychiatry

## 2017-11-27 ENCOUNTER — Encounter: Payer: Self-pay | Admitting: Emergency Medicine

## 2017-12-06 ENCOUNTER — Ambulatory Visit: Payer: BLUE CROSS/BLUE SHIELD | Admitting: Psychiatry

## 2017-12-06 ENCOUNTER — Encounter: Payer: Self-pay | Admitting: Psychiatry

## 2017-12-06 DIAGNOSIS — F411 Generalized anxiety disorder: Secondary | ICD-10-CM | POA: Diagnosis not present

## 2017-12-06 DIAGNOSIS — F4010 Social phobia, unspecified: Secondary | ICD-10-CM

## 2017-12-06 DIAGNOSIS — F4001 Agoraphobia with panic disorder: Secondary | ICD-10-CM | POA: Diagnosis not present

## 2017-12-06 DIAGNOSIS — F3181 Bipolar II disorder: Secondary | ICD-10-CM

## 2017-12-06 DIAGNOSIS — F900 Attention-deficit hyperactivity disorder, predominantly inattentive type: Secondary | ICD-10-CM

## 2017-12-06 MED ORDER — LISDEXAMFETAMINE DIMESYLATE 50 MG PO CAPS: 50.0000 mg | ORAL_CAPSULE | ORAL | 0 refills | Status: DC

## 2017-12-06 MED ORDER — LISDEXAMFETAMINE DIMESYLATE 50 MG PO CAPS: 50.0000 mg | ORAL_CAPSULE | Freq: Every day | ORAL | 0 refills | Status: DC

## 2017-12-06 NOTE — Progress Notes (Addendum)
Marissa Holmes [id number] 1985-03-21 32 y.o.  Subjective:   Patient ID:  Marissa Holmes is a 32 y.o. (DOB Feb 07, 1986) female.  Chief Complaint: No chief complaint on file. This was a 25-minute appointment  HPI Marissa Holmes presents to the office today for follow-up of seveal dxes. Overall not bad.  Started gym has been good. No outbursts. Lonely a lot.  Not working and H travels several days/week. Better self care helps.  Anx around birthdays and holidays DT broken rel with parents.  Occ causes panic. Patient reports stable mood and denies depressed or irritable moods.  Patient denies any recent difficulty with anxiety.  Patient denies difficulty with sleep initiation or maintenance. Denies appetite disturbance.  Patient reports that energy and motivation have been good.  Patient denies any difficulty with concentration.  Patient denies any suicidal ideation.  Previous psych medication trials include citalopram, paroxetine 60 with no response, sertraline 200, Seroquel which caused sedation, and Vega, Latuda with mouth ulcers.  Review of Systems:  Review of Systems  Neurological: Positive for headaches. Negative for tremors and weakness.  Psychiatric/Behavioral: Negative for agitation, behavioral problems, confusion, decreased concentration, dysphoric mood, hallucinations, self-injury, sleep disturbance and suicidal ideas. The patient is not nervous/anxious and is not hyperactive.     Medications: I have reviewed the patient's current medications.  Current Outpatient Medications  Medication Sig Dispense Refill  . ALPRAZolam (XANAX) 0.25 MG tablet Take 0.25 mg by mouth daily as needed (TK 1-2).     . Carbamazepine (EQUETRO) 300 MG CP12 Take 900 mg by mouth at bedtime.     . clonazePAM (KLONOPIN) 0.5 MG tablet Take 0.5 mg by mouth. 1 PO QAM, 2 QHS    . lamoTRIgine (LAMICTAL) 100 MG tablet Take 100 mg by mouth 2 (two) times daily.     Marland Kitchen lisdexamfetamine (VYVANSE)  50 MG capsule Take 1 capsule (50 mg total) by mouth every morning. 30 capsule 0  . Multiple Vitamin (THERA) TABS Take by mouth.    . sertraline (ZOLOFT) 50 MG tablet TAKE 1 TABLET BY MOUTH ONCE A DAY 30 tablet 0  . SUMAtriptan (IMITREX) 50 MG tablet TAKE 1 TABLET BY MOUTH AS NEEDED FOR MIGRAINE, MAY REPEAT DOSE AFTER 2 HOURS AS NEEDED    . topiramate (TOPAMAX) 50 MG tablet TAKE 1 TABLET(50 MG) BY MOUTH EVERY DAY    . traZODone (DESYREL) 150 MG tablet Take 150 mg by mouth at bedtime.   0  . [START ON 01/03/2018] lisdexamfetamine (VYVANSE) 50 MG capsule Take 1 capsule (50 mg total) by mouth daily. 30 capsule 0  . [START ON 01/31/2018] lisdexamfetamine (VYVANSE) 50 MG capsule Take 1 capsule (50 mg total) by mouth daily. 30 capsule 0  . Mefenamic Acid (PONSTEL) 250 MG CAPS 2 capsules (500 mg) po once and the onset of menses, then 1 capsule (250 mg) every 6 hours prn cramping max 3 days (Patient not taking: Reported on 12/06/2017) 13 capsule 13   No current facility-administered medications for this visit.     Medication Side Effects: Other: dry    Allergies: No Known Allergies  Past Medical History:  Diagnosis Date  . Abnormal Pap smear of cervix   . Anxiety   . Attention deficit disorder (ADD)   . Bipolar 1 disorder (HCC)   . Mild depression (HCC)   . Type 2 diabetes mellitus (HCC)     Family History  Problem Relation Age of Onset  . Colon cancer Paternal Grandmother 68  . Hypertension  Paternal Grandmother   . Anxiety disorder Mother   . Depression Mother   . Hypertension Father   . Depression Father   . Bipolar disorder Maternal Grandmother   . Depression Maternal Grandmother   . Melanoma Maternal Grandfather   . Prostate cancer Maternal Grandfather   . Melanoma Paternal Grandfather     Social History   Socioeconomic History  . Marital status: Single    Spouse name: Not on file  . Number of children: Not on file  . Years of education: Not on file  . Highest education  level: Not on file  Occupational History  . Not on file  Social Needs  . Financial resource strain: Not on file  . Food insecurity:    Worry: Not on file    Inability: Not on file  . Transportation needs:    Medical: Not on file    Non-medical: Not on file  Tobacco Use  . Smoking status: Never Smoker  . Smokeless tobacco: Never Used  Substance and Sexual Activity  . Alcohol use: Yes    Frequency: Never  . Drug use: Never  . Sexual activity: Yes  Lifestyle  . Physical activity:    Days per week: Not on file    Minutes per session: Not on file  . Stress: Not on file  Relationships  . Social connections:    Talks on phone: Not on file    Gets together: Not on file    Attends religious service: Not on file    Active member of club or organization: Not on file    Attends meetings of clubs or organizations: Not on file    Relationship status: Not on file  . Intimate partner violence:    Fear of current or ex partner: Not on file    Emotionally abused: Not on file    Physically abused: Not on file    Forced sexual activity: Not on file  Other Topics Concern  . Not on file  Social History Narrative  . Not on file    Past Medical History, Surgical history, Social history, and Family history were reviewed and updated as appropriate.  GM died 09-22-22.  Went to funeral alone caused panic.  Saw M first time in 20 years, she's toxic.  Please see review of systems for further details on the patient's review from today.   Objective:   Physical Exam:  There were no vitals taken for this visit.  Physical Exam  Constitutional: She is oriented to person, place, and time. She appears well-developed. No distress.  Musculoskeletal: She exhibits no deformity.  Neurological: She is alert and oriented to person, place, and time. She displays no atrophy and no tremor. Coordination and gait normal.  Psychiatric: She has a normal mood and affect. Her speech is normal and behavior is normal.  Judgment and thought content normal. Her mood appears not anxious. Her affect is not angry, not blunt, not labile and not inappropriate. Cognition and memory are normal. She does not exhibit a depressed mood. She expresses no homicidal and no suicidal ideation. She expresses no suicidal plans and no homicidal plans.  Insight intact. No auditory or visual hallucinations. No delusions.     Lab Review:     Component Value Date/Time   NA 141 08/23/2013 0919   K 3.9 08/23/2013 0919   CL 111 (H) 08/23/2013 0919   CO2 27 08/23/2013 0919   GLUCOSE 91 08/23/2013 0919   BUN 17 08/23/2013 0919  CREATININE 0.77 08/23/2013 0919   CALCIUM 8.3 (L) 08/23/2013 0919   PROT 7.3 08/23/2013 0919   ALBUMIN 4.0 08/23/2013 0919   AST 25 08/23/2013 0919   ALT 31 08/23/2013 0919   ALKPHOS 76 08/23/2013 0919   BILITOT 0.3 08/23/2013 0919   GFRNONAA >60 08/23/2013 0919   GFRAA >60 08/23/2013 0919       Component Value Date/Time   WBC 5.5 08/23/2013 0919   RBC 4.44 08/23/2013 0919   HGB 13.2 08/23/2013 0919   HCT 40.4 08/23/2013 0919   PLT 301 08/23/2013 0919   MCV 91 08/23/2013 0919   MCH 29.8 08/23/2013 0919   MCHC 32.7 08/23/2013 0919   RDW 12.2 08/23/2013 0919   LYMPHSABS 2.1 08/23/2013 0919   MONOABS 0.4 08/23/2013 0919   EOSABS 0.1 08/23/2013 0919   BASOSABS 0.0 08/23/2013 0919    No results found for: POCLITH, LITHIUM   No results found for: PHENYTOIN, PHENOBARB, VALPROATE, CBMZ   .res Assessment: Plan:    Bipolar II disorder (HCC)  Generalized anxiety disorder  Social anxiety disorder  Panic disorder with agoraphobia  Attention deficit hyperactivity disorder (ADHD), predominantly inattentive type - Plan: lisdexamfetamine (VYVANSE) 50 MG capsule, lisdexamfetamine (VYVANSE) 50 MG capsule, lisdexamfetamine (VYVANSE) 50 MG capsule May have PTSD DT childhoold   Greater than 50% of face to face time with patient was spent on counseling and coordination of care. We discussed  multipled diagnoses and meds, necessary polypharmacy.  Tolerates meds to good effect.  Pleased with the balance. Zoloft helped anxiety added in May.  Call if anger problems recur.  FU 5 months  Cambrey Staggers MD, DFAPA  Please see After Visit Summary for patient specific instructions.  No future appointments.  No orders of the defined types were placed in this encounter.     -------------------------------

## 2017-12-21 ENCOUNTER — Other Ambulatory Visit: Payer: Self-pay | Admitting: Psychiatry

## 2017-12-26 ENCOUNTER — Other Ambulatory Visit: Payer: Self-pay | Admitting: Psychiatry

## 2018-01-19 ENCOUNTER — Other Ambulatory Visit: Payer: Self-pay | Admitting: Psychiatry

## 2018-02-27 ENCOUNTER — Telehealth: Payer: Self-pay

## 2018-02-27 NOTE — Telephone Encounter (Signed)
Prior Auth approved by [employer] for [employer] 300 Mg 02/27/2018-03/01/2019.

## 2018-03-13 ENCOUNTER — Other Ambulatory Visit: Payer: Self-pay | Admitting: Psychiatry

## 2018-03-16 ENCOUNTER — Other Ambulatory Visit: Payer: Self-pay

## 2018-03-16 MED ORDER — ALPRAZOLAM 0.25 MG PO TABS: 0.2500 mg | ORAL_TABLET | Freq: Every day | ORAL | 1 refills | Status: DC | PRN

## 2018-03-26 ENCOUNTER — Other Ambulatory Visit: Payer: Self-pay | Admitting: Psychiatry

## 2018-04-04 ENCOUNTER — Other Ambulatory Visit: Payer: Self-pay | Admitting: Psychiatry

## 2018-04-04 DIAGNOSIS — F900 Attention-deficit hyperactivity disorder, predominantly inattentive type: Secondary | ICD-10-CM

## 2018-04-05 NOTE — Telephone Encounter (Signed)
rx should be pended for approval

## 2018-04-30 ENCOUNTER — Ambulatory Visit: Payer: BLUE CROSS/BLUE SHIELD | Admitting: Psychiatry

## 2018-05-02 ENCOUNTER — Encounter: Payer: Self-pay | Admitting: Psychiatry

## 2018-05-02 ENCOUNTER — Ambulatory Visit (INDEPENDENT_AMBULATORY_CARE_PROVIDER_SITE_OTHER): Payer: BLUE CROSS/BLUE SHIELD | Admitting: Psychiatry

## 2018-05-02 ENCOUNTER — Other Ambulatory Visit: Payer: Self-pay

## 2018-05-02 DIAGNOSIS — F4010 Social phobia, unspecified: Secondary | ICD-10-CM | POA: Diagnosis not present

## 2018-05-02 DIAGNOSIS — F900 Attention-deficit hyperactivity disorder, predominantly inattentive type: Secondary | ICD-10-CM

## 2018-05-02 DIAGNOSIS — F3181 Bipolar II disorder: Secondary | ICD-10-CM | POA: Diagnosis not present

## 2018-05-02 DIAGNOSIS — F411 Generalized anxiety disorder: Secondary | ICD-10-CM

## 2018-05-02 DIAGNOSIS — F4001 Agoraphobia with panic disorder: Secondary | ICD-10-CM

## 2018-05-02 DIAGNOSIS — F3281 Premenstrual dysphoric disorder: Secondary | ICD-10-CM

## 2018-05-02 MED ORDER — LISDEXAMFETAMINE DIMESYLATE 50 MG PO CAPS: 50.0000 mg | ORAL_CAPSULE | Freq: Every day | ORAL | 0 refills | Status: DC

## 2018-05-02 MED ORDER — LISDEXAMFETAMINE DIMESYLATE 50 MG PO CAPS: 50.0000 mg | ORAL_CAPSULE | ORAL | 0 refills | Status: DC

## 2018-05-02 NOTE — Progress Notes (Signed)
Marissa Holmes [id number] 1985/05/05 33 y.o.  Subjective:   Patient ID:  Marissa Holmes is a 33 y.o. (DOB May 17, 1985) female.  Chief Complaint:  Chief Complaint  Patient presents with  . Follow-up    med management  . Stress    HPI Marissa Holmes presents to the office today for follow-up of seveal dxes.  Seen December 06, 2017.  No med changes were made.  M moved in law moved in.  Huge adjustment.  She is bipolar and likely dementia.  H new job and hard for [employer].  Everyone around all the time and she needs alone time.    Rare Xanax.  #30 every 2 mos mainly with PMDD  Overall not bad.  Started gym has been good until Covid.  Faith helps. No outbursts. .  Not working and H travels several days/week except for Covid. Better self care helps.  Anx around birthdays and holidays DT broken rel with parents.  Occ causes panic.  Patient reports stable mood and denies depressed or irritable moods.  Patient denies any recent difficulty with anxiety.  Patient denies difficulty with sleep initiation or maintenance. Denies appetite disturbance.  Patient reports that energy and motivation have been good.  Patient denies any difficulty with concentration.  Patient denies any suicidal ideation.  Previous psych medication trials include citalopram, paroxetine 60 with no response, sertraline 200, Seroquel which caused sedation, and Vega, Latuda with mouth ulcers.  Review of Systems:  Review of Systems  Neurological: Positive for headaches. Negative for tremors and weakness.  Psychiatric/Behavioral: Negative for agitation, behavioral problems, confusion, decreased concentration, dysphoric mood, hallucinations, self-injury, sleep disturbance and suicidal ideas. The patient is not nervous/anxious and is not hyperactive.     Medications: I have reviewed the patient's current medications.  Current Outpatient Medications  Medication Sig Dispense Refill  . ALPRAZolam (XANAX)  0.25 MG tablet Take 1 tablet (0.25 mg total) by mouth daily as needed (TK 1-2). 30 tablet 1  . clonazePAM (KLONOPIN) 0.5 MG tablet TAKE 1 TABLET BY MOUTH EVERY MORNING AND2 TABLETS BY MOUTH AT BEDTIME 90 tablet 1  . EQUETRO 300 MG CP12 TAKE 3 CAPSULES BY MOUTH EVERY NIGHT AT BEDTIME 90 each 5  . lamoTRIgine (LAMICTAL) 100 MG tablet Take 100 mg by mouth 2 (two) times daily.     Marland Kitchen lisdexamfetamine (VYVANSE) 50 MG capsule Take 1 capsule (50 mg total) by mouth every morning. 30 capsule 0  . [START ON 05/30/2018] lisdexamfetamine (VYVANSE) 50 MG capsule Take 1 capsule (50 mg total) by mouth daily. 30 capsule 0  . sertraline (ZOLOFT) 50 MG tablet TAKE 1 TABLET BY MOUTH ONCE DAILY 30 tablet 4  . SUMAtriptan (IMITREX) 50 MG tablet TAKE 1 TABLET BY MOUTH AS NEEDED FOR MIGRAINE, MAY REPEAT DOSE AFTER 2 HOURS AS NEEDED    . topiramate (TOPAMAX) 50 MG tablet TAKE 1 TABLET(50 MG) BY MOUTH EVERY DAY    . traZODone (DESYREL) 150 MG tablet TAKE ONE TABLET BY MOUTH EVERY NIGHT AT BEDTIME 90 tablet 0  . VYVANSE 50 MG capsule TAKE 1 CAPSULE BY MOUTH ONCE DAILY 30 capsule 0  . [START ON 06/27/2018] lisdexamfetamine (VYVANSE) 50 MG capsule Take 1 capsule (50 mg total) by mouth daily. 30 capsule 0  . Mefenamic Acid (PONSTEL) 250 MG CAPS 2 capsules (500 mg) po once and the onset of menses, then 1 capsule (250 mg) every 6 hours prn cramping max 3 days (Patient not taking: Reported on 12/06/2017) 13 capsule 13  .  Multiple Vitamin (THERA) TABS Take by mouth.     No current facility-administered medications for this visit.     Medication Side Effects: Other: dry    Allergies: No Known Allergies  Past Medical History:  Diagnosis Date  . Abnormal Pap smear of cervix   . Anxiety   . Attention deficit disorder (ADD)   . Bipolar 1 disorder (HCC)   . Mild depression (HCC)   . Type 2 diabetes mellitus (HCC)     Family History  Problem Relation Age of Onset  . Colon cancer Paternal Grandmother 5  . Hypertension  Paternal Grandmother   . Anxiety disorder Mother   . Depression Mother   . Hypertension Father   . Depression Father   . Bipolar disorder Maternal Grandmother   . Depression Maternal Grandmother   . Melanoma Maternal Grandfather   . Prostate cancer Maternal Grandfather   . Melanoma Paternal Grandfather     Social History   Socioeconomic History  . Marital status: Single    Spouse name: Not on file  . Number of children: Not on file  . Years of education: Not on file  . Highest education level: Not on file  Occupational History  . Not on file  Social Needs  . Financial resource strain: Not on file  . Food insecurity:    Worry: Not on file    Inability: Not on file  . Transportation needs:    Medical: Not on file    Non-medical: Not on file  Tobacco Use  . Smoking status: Never Smoker  . Smokeless tobacco: Never Used  Substance and Sexual Activity  . Alcohol use: Yes    Frequency: Never  . Drug use: Never  . Sexual activity: Yes  Lifestyle  . Physical activity:    Days per week: Not on file    Minutes per session: Not on file  . Stress: Not on file  Relationships  . Social connections:    Talks on phone: Not on file    Gets together: Not on file    Attends religious service: Not on file    Active member of club or organization: Not on file    Attends meetings of clubs or organizations: Not on file    Relationship status: Not on file  . Intimate partner violence:    Fear of current or ex partner: Not on file    Emotionally abused: Not on file    Physically abused: Not on file    Forced sexual activity: Not on file  Other Topics Concern  . Not on file  Social History Narrative  . Not on file    Past Medical History, Surgical history, Social history, and Family history were reviewed and updated as appropriate.  GM died 20-Oct-2022.  Went to funeral alone caused panic.  Saw M first time in 20 years, she's toxic.  Please see review of systems for further details on  the patient's review from today.   Objective:   Physical Exam:  There were no vitals taken for this visit.  Physical Exam Constitutional:      General: She is not in acute distress.    Appearance: She is well-developed.  Neurological:     Mental Status: She is alert and oriented to person, place, and time.     Cranial Nerves: No dysarthria.  Psychiatric:        Attention and Perception: She is attentive. She does not perceive auditory hallucinations.  Mood and Affect: Mood is not anxious or depressed. Affect is not labile, blunt, angry or inappropriate.        Speech: Speech normal. Speech is not rapid and pressured or slurred.        Behavior: Behavior normal.        Thought Content: Thought content normal. Thought content is not paranoid. Thought content does not include homicidal or suicidal ideation.        Cognition and Memory: Cognition normal.        Judgment: Judgment normal.     Comments: Insight intact. No auditory or visual hallucinations. No delusions.  No outbursts of anger as in the past.     Lab Review:     Component Value Date/Time   NA 141 08/23/2013 0919   K 3.9 08/23/2013 0919   CL 111 (H) 08/23/2013 0919   CO2 27 08/23/2013 0919   GLUCOSE 91 08/23/2013 0919   BUN 17 08/23/2013 0919   CREATININE 0.77 08/23/2013 0919   CALCIUM 8.3 (L) 08/23/2013 0919   PROT 7.3 08/23/2013 0919   ALBUMIN 4.0 08/23/2013 0919   AST 25 08/23/2013 0919   ALT 31 08/23/2013 0919   ALKPHOS 76 08/23/2013 0919   BILITOT 0.3 08/23/2013 0919   GFRNONAA >60 08/23/2013 0919   GFRAA >60 08/23/2013 0919       Component Value Date/Time   WBC 5.5 08/23/2013 0919   RBC 4.44 08/23/2013 0919   HGB 13.2 08/23/2013 0919   HCT 40.4 08/23/2013 0919   PLT 301 08/23/2013 0919   MCV 91 08/23/2013 0919   MCH 29.8 08/23/2013 0919   MCHC 32.7 08/23/2013 0919   RDW 12.2 08/23/2013 0919   LYMPHSABS 2.1 08/23/2013 0919   MONOABS 0.4 08/23/2013 0919   EOSABS 0.1 08/23/2013 0919    BASOSABS 0.0 08/23/2013 0919    No results found for: POCLITH, LITHIUM   No results found for: PHENYTOIN, PHENOBARB, VALPROATE, CBMZ   .res Assessment: Plan:    Bipolar II disorder (HCC)  Generalized anxiety disorder  Social anxiety disorder  Panic disorder with agoraphobia  Attention deficit hyperactivity disorder (ADHD), predominantly inattentive type - Plan: lisdexamfetamine (VYVANSE) 50 MG capsule, lisdexamfetamine (VYVANSE) 50 MG capsule, lisdexamfetamine (VYVANSE) 50 MG capsule  PMDD (premenstrual dysphoric disorder) May have PTSD DT childhoold   Supportive therapy dealing with care of mother in law.  Self care encouraged.   We discussed multipled diagnoses and meds, necessary polypharmacy.  Tolerates meds to good effect. No need for med changes.   Pleased with the balance. Zoloft helped anxiety added in May.  Call if anger problems recur.  Bipolar disorder managed with the Equetro.  Anxiety disorders managed with sertraline to good effect.  ADD managed with Vyvanse 50  PMDD managed with as needed Xanax.  I connected with patient by a video enabled telemedicine application or telephone, with their informed consent, and verified patient privacy and that I am speaking with the correct person using two identifiers.  I was located work  and patient home  This was a 15-minute phone call.   FU 6 months  Najwa Staggers MD, DFAPA  Please see After Visit Summary for patient specific instructions.  No future appointments.  No orders of the defined types were placed in this encounter.     -------------------------------

## 2018-05-25 ENCOUNTER — Other Ambulatory Visit: Payer: Self-pay | Admitting: Psychiatry

## 2018-05-26 NOTE — Telephone Encounter (Signed)
Please submit.

## 2018-06-05 ENCOUNTER — Other Ambulatory Visit: Payer: Self-pay | Admitting: Psychiatry

## 2018-06-13 ENCOUNTER — Other Ambulatory Visit: Payer: Self-pay | Admitting: Psychiatry

## 2018-07-23 ENCOUNTER — Other Ambulatory Visit: Payer: Self-pay | Admitting: Psychiatry

## 2018-08-13 ENCOUNTER — Other Ambulatory Visit: Payer: Self-pay | Admitting: Psychiatry

## 2018-08-13 DIAGNOSIS — F900 Attention-deficit hyperactivity disorder, predominantly inattentive type: Secondary | ICD-10-CM

## 2018-08-13 NOTE — Telephone Encounter (Signed)
Last fill 05/20

## 2018-08-27 ENCOUNTER — Other Ambulatory Visit: Payer: Self-pay | Admitting: Psychiatry

## 2018-08-27 NOTE — Telephone Encounter (Signed)
Last fill 06/15 Has f/up in Sept

## 2018-09-12 ENCOUNTER — Other Ambulatory Visit: Payer: Self-pay | Admitting: Psychiatry

## 2018-09-12 DIAGNOSIS — F900 Attention-deficit hyperactivity disorder, predominantly inattentive type: Secondary | ICD-10-CM

## 2018-09-12 MED ORDER — LISDEXAMFETAMINE DIMESYLATE 50 MG PO CAPS: 50.0000 mg | ORAL_CAPSULE | ORAL | 0 refills | Status: DC

## 2018-09-24 ENCOUNTER — Other Ambulatory Visit: Payer: Self-pay | Admitting: Psychiatry

## 2018-09-25 NOTE — Telephone Encounter (Signed)
Has appt 09/17,

## 2018-10-23 ENCOUNTER — Ambulatory Visit: Payer: BLUE CROSS/BLUE SHIELD | Admitting: Psychiatry

## 2018-10-25 ENCOUNTER — Other Ambulatory Visit: Payer: Self-pay

## 2018-10-25 ENCOUNTER — Encounter: Payer: Self-pay | Admitting: Psychiatry

## 2018-10-25 ENCOUNTER — Ambulatory Visit (INDEPENDENT_AMBULATORY_CARE_PROVIDER_SITE_OTHER): Payer: BC Managed Care – PPO | Admitting: Psychiatry

## 2018-10-25 DIAGNOSIS — F4001 Agoraphobia with panic disorder: Secondary | ICD-10-CM | POA: Diagnosis not present

## 2018-10-25 DIAGNOSIS — F3281 Premenstrual dysphoric disorder: Secondary | ICD-10-CM

## 2018-10-25 DIAGNOSIS — F4010 Social phobia, unspecified: Secondary | ICD-10-CM

## 2018-10-25 DIAGNOSIS — F3181 Bipolar II disorder: Secondary | ICD-10-CM | POA: Diagnosis not present

## 2018-10-25 DIAGNOSIS — F411 Generalized anxiety disorder: Secondary | ICD-10-CM

## 2018-10-25 DIAGNOSIS — F900 Attention-deficit hyperactivity disorder, predominantly inattentive type: Secondary | ICD-10-CM

## 2018-10-25 MED ORDER — LISDEXAMFETAMINE DIMESYLATE 50 MG PO CAPS: 50.0000 mg | ORAL_CAPSULE | Freq: Every day | ORAL | 0 refills | Status: DC

## 2018-10-25 MED ORDER — LISDEXAMFETAMINE DIMESYLATE 50 MG PO CAPS: 50.0000 mg | ORAL_CAPSULE | ORAL | 0 refills | Status: DC

## 2018-10-25 NOTE — Progress Notes (Signed)
Marissa Holmes [id number] 1986/01/10 33 y.o.   Virtual Visit via Telephone Note  I connected with pt by telephone and verified that I am speaking with the correct person using two identifiers.   I discussed the limitations, risks, security and privacy concerns of performing an evaluation and management service by telephone and the availability of in person appointments. I also discussed with the patient that there may be a patient responsible charge related to this service. The patient expressed understanding and agreed to proceed.  I discussed the assessment and treatment plan with the patient. The patient was provided an opportunity to ask questions and all were answered. The patient agreed with the plan and demonstrated an understanding of the instructions.   The patient was advised to call back or seek an in-person evaluation if the symptoms worsen or if the condition fails to improve as anticipated.  I provided 25 minutes of non-face-to-face time during this encounter. The call started at 945 and ended at 1010. The patient was located at home and the provider was located office.   Subjective:   Patient ID:  Marissa Holmes is a 33 y.o. (DOB 1985-02-26) female.  Chief Complaint:  Chief Complaint  Patient presents with  . Follow-up    Medication Management  . Anxiety    Medication Management  . ADHD    Anxiety Patient reports no confusion, decreased concentration, nervous/anxious behavior or suicidal ideas.     Marissa Holmes presents to the office today for follow-up of seveal dxes.  Last visit March 2020.  She was doing well..  No med changes were made.  She has been maintained on Vyvanse for ADHD, Equetro for bipolar disorder, clonazepam and alprazolam for anxiety, lamotrigine for bipolar disorder, sertraline for anxiety and Topamax for migraine headache and trazodone for sleep.  A lot of stress.  M moved in law moved in.  Huge adjustment.  She is  bipolar and likely dementia.  Building a new house.  Home schooling.  H new job and hard for [employer] and working from home.  No opportunity for alone time.  Everyone around all the time and she needs alone time.  Adam's ex W moving to [facility] and wanted to take the boys.  Boys decided to do that and left in July.  The most difficult thing for her.  Last couple of weeks very hard.  Several panic attacks after they left.  Girls having difficulty in school.  All the kids struggling and unhappy and I can't fix it.  H Adam.  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  Letting go of things initially caused guilt but now feels better.  Still satisfied with meds.  Anxiety has been situational.  Overall not bad.  Started gym back at gym boxing has been good.  Faith helps. No outbursts. .  Not working and H travels several days/week except for Covid. Better self care helps.  Anx around birthdays and holidays DT broken rel with parents.  Occ causes panic.  Patient reports stable mood and denies depressed or irritable moods. No outbursts.  Anxiety is worse. Patient denies difficulty with sleep initiation or maintenance as long as no late caffeine. Denies appetite disturbance.  Patient reports that energy and motivation have been good.  Patient denies any difficulty with concentration.  Patient denies any suicidal ideation.  Previous psych medication trials include citalopram, paroxetine 60 with no response, sertraline 200, Seroquel which caused sedation, and Wendee Beavers, Latuda with  mouth ulcers.  Review of Systems:  Review of Systems  Neurological: Positive for headaches. Negative for tremors and weakness.  Psychiatric/Behavioral: Negative for agitation, behavioral problems, confusion, decreased concentration, dysphoric mood, hallucinations, self-injury, sleep disturbance and suicidal ideas. The patient is not nervous/anxious and is not hyperactive.     Medications: I have reviewed the patient's current  medications.  Current Outpatient Medications  Medication Sig Dispense Refill  . ALPRAZolam (XANAX) 0.25 MG tablet TAKE 1 TABLET BY MOUTH DAILY AS NEEDED 30 tablet 0  . clonazePAM (KLONOPIN) 0.5 MG tablet TAKE 1 TABLET BY MOUTH EVERY MORNING AND2 TABLETS BY MOUTH AT BEDTIME 90 tablet 2  . EQUETRO 300 MG CP12 TAKE 3 CAPSULES BY MOUTH EVERY NIGHT AT BEDTIME 90 each 5  . fluticasone (FLONASE) 50 MCG/ACT nasal spray Place into both nostrils daily.    Marland Kitchen. lamoTRIgine (LAMICTAL) 100 MG tablet TAKE 1 TABLET BY MOUTH TWICE DAILY 180 tablet 1  . [START ON 12/20/2018] lisdexamfetamine (VYVANSE) 50 MG capsule Take 1 capsule (50 mg total) by mouth daily. 30 capsule 0  . [START ON 11/22/2018] lisdexamfetamine (VYVANSE) 50 MG capsule Take 1 capsule (50 mg total) by mouth daily. 30 capsule 0  . lisdexamfetamine (VYVANSE) 50 MG capsule Take 1 capsule (50 mg total) by mouth every morning. 30 capsule 0  . montelukast (SINGULAIR) 10 MG tablet     . Multiple Vitamin (THERA) TABS Take by mouth.    . sertraline (ZOLOFT) 50 MG tablet TAKE 1 TABLET BY MOUTH ONCE A DAY 30 tablet 4  . SUMAtriptan (IMITREX) 50 MG tablet TAKE 1 TABLET BY MOUTH AS NEEDED FOR MIGRAINE, MAY REPEAT DOSE AFTER 2 HOURS AS NEEDED    . topiramate (TOPAMAX) 50 MG tablet TAKE 1 TABLET(50 MG) BY MOUTH EVERY DAY    . traZODone (DESYREL) 150 MG tablet TAKE 1 TABLET BY MOUTH EVERY NIGHT AT BEDTIME 90 tablet 1   No current facility-administered medications for this visit.     Medication Side Effects: Other: dry    Allergies: No Known Allergies  Past Medical History:  Diagnosis Date  . Abnormal Pap smear of cervix   . Anxiety   . Attention deficit disorder (ADD)   . Bipolar 1 disorder (HCC)   . Mild depression (HCC)   . Type 2 diabetes mellitus (HCC)     Family History  Problem Relation Age of Onset  . Colon cancer Paternal Grandmother [id number]  . Hypertension Paternal Grandmother   . Anxiety disorder Mother   . Depression Mother   .  Hypertension Father   . Depression Father   . Bipolar disorder Maternal Grandmother   . Depression Maternal Grandmother   . Melanoma Maternal Grandfather   . Prostate cancer Maternal Grandfather   . Melanoma Paternal Grandfather     Social History   Socioeconomic History  . Marital status: Single    Spouse name: Not on file  . Number of children: Not on file  . Years of education: Not on file  . Highest education level: Not on file  Occupational History  . Not on file  Social Needs  . Financial resource strain: Not on file  . Food insecurity    Worry: Not on file    Inability: Not on file  . Transportation needs    Medical: Not on file    Non-medical: Not on file  Tobacco Use  . Smoking status: Never Smoker  . Smokeless tobacco: Never Used  Substance and Sexual Activity  . Alcohol  use: Yes    Frequency: Never  . Drug use: Never  . Sexual activity: Yes  Lifestyle  . Physical activity    Days per week: Not on file    Minutes per session: Not on file  . Stress: Not on file  Relationships  . Social Musician on phone: Not on file    Gets together: Not on file    Attends religious service: Not on file    Active member of club or organization: Not on file    Attends meetings of clubs or organizations: Not on file    Relationship status: Not on file  . Intimate partner violence    Fear of current or ex partner: Not on file    Emotionally abused: Not on file    Physically abused: Not on file    Forced sexual activity: Not on file  Other Topics Concern  . Not on file  Social History Narrative  . Not on file    Past Medical History, Surgical history, Social history, and Family history were reviewed and updated as appropriate.  GM died 21-Oct-2022.  Went to funeral alone caused panic.  Saw M first time in 20 years, she's toxic.  Please see review of systems for further details on the patient's review from today.   Objective:   Physical Exam:  There were no  vitals taken for this visit.  Physical Exam Constitutional:      General: She is not in acute distress.    Appearance: She is well-developed.  Neurological:     Mental Status: She is alert and oriented to person, place, and time.     Cranial Nerves: No dysarthria.  Psychiatric:        Attention and Perception: She is attentive. She does not perceive auditory hallucinations.        Mood and Affect: Mood is anxious. Mood is not depressed. Affect is not labile, blunt, angry or inappropriate.        Speech: Speech normal. Speech is not rapid and pressured or slurred.        Behavior: Behavior is not agitated.        Thought Content: Thought content normal. Thought content is not paranoid. Thought content does not include homicidal or suicidal ideation.        Cognition and Memory: Cognition normal.        Judgment: Judgment normal.     Comments: Insight intact. No auditory or visual hallucinations. No delusions.  No outbursts of anger as in the past.     Lab Review:     Component Value Date/Time   NA 141 08/23/2013 0919   K 3.9 08/23/2013 0919   CL 111 (H) 08/23/2013 0919   CO2 27 08/23/2013 0919   GLUCOSE 91 08/23/2013 0919   BUN 17 08/23/2013 0919   CREATININE 0.77 08/23/2013 0919   CALCIUM 8.3 (L) 08/23/2013 0919   PROT 7.3 08/23/2013 0919   ALBUMIN 4.0 08/23/2013 0919   AST 25 08/23/2013 0919   ALT 31 08/23/2013 0919   ALKPHOS 76 08/23/2013 0919   BILITOT 0.3 08/23/2013 0919   GFRNONAA >60 08/23/2013 0919   GFRAA >60 08/23/2013 0919       Component Value Date/Time   WBC 5.5 08/23/2013 0919   RBC 4.44 08/23/2013 0919   HGB 13.2 08/23/2013 0919   HCT 40.4 08/23/2013 0919   PLT 301 08/23/2013 0919   MCV 91 08/23/2013 0919   MCH 29.8 08/23/2013  0919   MCHC 32.7 08/23/2013 0919   RDW 12.2 08/23/2013 0919   LYMPHSABS 2.1 08/23/2013 0919   MONOABS 0.4 08/23/2013 0919   EOSABS 0.1 08/23/2013 0919   BASOSABS 0.0 08/23/2013 0919    No results found for: POCLITH,  LITHIUM   No results found for: PHENYTOIN, PHENOBARB, VALPROATE, CBMZ   .res Assessment: Plan:    Bipolar II disorder (HCC)  Panic disorder with agoraphobia  Generalized anxiety disorder  Social anxiety disorder  Attention deficit hyperactivity disorder (ADHD), predominantly inattentive type - Plan: lisdexamfetamine (VYVANSE) 50 MG capsule, lisdexamfetamine (VYVANSE) 50 MG capsule, lisdexamfetamine (VYVANSE) 50 MG capsule  PMDD (premenstrual dysphoric disorder) May have PTSD DT childhoold  Overall has more anxiety since last here.  Anger managed, she has a history of anger outbursts that have caused significant social difficulty.  Bipolar symptoms are under control.  Greater than 50% 25 mins of phone to phone time with patient was spent on counseling and coordination of care. We discussed the following. Supportive therapy dealing with care of mother in law and other new stressors.  Pt needs some along time and encouraged to pursue it.  Disc problem solving techniques and involve H.  Encourage exercise as stress management.  .  Self care encouraged.   We discussed multipled diagnoses and meds, necessary polypharmacy.  Tolerates meds to good effect.  Has made good decisions for self care.  Zoloft helped anxiety added in May.  She has had a few more panic attacks but she feels her situational.  She will call if they continue or if they worsen.  Call if anger problems recur.  Bipolar disorder managed with the Equetro.  Anxiety disorders managed with sertraline to good effect.  ADD managed with Vyvanse 50  PMDD managed with as needed Xanax.  No need for med changes except consider increasing sertraline for panic but that can cause mood swings so we are trying to minimize the dose.  Consider the alternative of fluvoxamine which has a good anti-anxiety effect but lower risk of triggering mania..   Pleased with the balance.   Cont counseling every other week. Started 8 weeks ago.  FU  4- 6 months  Francie Staggersarey Cottle MD, DFAPA  Please see After Visit Summary for patient specific instructions.  No future appointments.  No orders of the defined types were placed in this encounter.     -------------------------------

## 2018-11-27 ENCOUNTER — Other Ambulatory Visit: Payer: Self-pay | Admitting: Psychiatry

## 2018-11-27 NOTE — Telephone Encounter (Signed)
Last visit 09/17 any refills?

## 2018-12-20 ENCOUNTER — Other Ambulatory Visit: Payer: Self-pay | Admitting: Psychiatry

## 2018-12-26 ENCOUNTER — Other Ambulatory Visit: Payer: Self-pay | Admitting: Obstetrics and Gynecology

## 2018-12-26 ENCOUNTER — Telehealth: Payer: Self-pay

## 2018-12-26 MED ORDER — MEFENAMIC ACID 250 MG PO CAPS: ORAL_CAPSULE | ORAL | 11 refills | Status: DC

## 2018-12-26 NOTE — Telephone Encounter (Signed)
Rx is in

## 2018-12-26 NOTE — Telephone Encounter (Signed)
Patient has just ran out of her rx for Mefenamic Acid (PONSTEL) 250 MG CAPS that you gave her at her 04/2017 visit. She is unable to request thru portal as it is no longer on her meds list. Requesting refill to her new pharmacy on [address] [place]. XM#[phone]

## 2018-12-27 ENCOUNTER — Telehealth: Payer: Self-pay

## 2018-12-27 NOTE — Telephone Encounter (Signed)
Called pharmacy x2, busy signal. Will try again later

## 2018-12-27 NOTE — Telephone Encounter (Signed)
Pharmacist aware is ok for 30 day supply

## 2018-12-27 NOTE — Telephone Encounter (Signed)
LMVM to notify request has been filled. Advised TRC with any further questions/concerns/needs.

## 2018-12-27 NOTE — Telephone Encounter (Signed)
Gerald Stabs, pharmacist from [facility], calling about rx for ponstel.  He is asking if it would be okay to give pt #30 at a time and decrease refills instead of #13 c 11rf.  [phone]

## 2019-01-28 ENCOUNTER — Other Ambulatory Visit: Payer: Self-pay | Admitting: Psychiatry

## 2019-01-28 DIAGNOSIS — F900 Attention-deficit hyperactivity disorder, predominantly inattentive type: Secondary | ICD-10-CM

## 2019-01-28 NOTE — Telephone Encounter (Signed)
Last apt 09/17

## 2019-03-01 ENCOUNTER — Other Ambulatory Visit: Payer: Self-pay | Admitting: Psychiatry

## 2019-03-01 DIAGNOSIS — F900 Attention-deficit hyperactivity disorder, predominantly inattentive type: Secondary | ICD-10-CM

## 2019-03-01 NOTE — Telephone Encounter (Signed)
Last apt 10/2018 due back Jan not scheduled yet

## 2019-04-01 ENCOUNTER — Other Ambulatory Visit: Payer: Self-pay | Admitting: Psychiatry

## 2019-04-01 DIAGNOSIS — F900 Attention-deficit hyperactivity disorder, predominantly inattentive type: Secondary | ICD-10-CM

## 2019-04-01 NOTE — Telephone Encounter (Signed)
Last apt 09/17, due back 4-6 months

## 2019-04-30 ENCOUNTER — Other Ambulatory Visit: Payer: Self-pay | Admitting: Psychiatry

## 2019-04-30 DIAGNOSIS — F900 Attention-deficit hyperactivity disorder, predominantly inattentive type: Secondary | ICD-10-CM

## 2019-04-30 NOTE — Telephone Encounter (Signed)
Last apt 10/2018, due back March for 6 months

## 2019-05-02 ENCOUNTER — Ambulatory Visit: Payer: Medicaid Other | Attending: Internal Medicine

## 2019-05-02 DIAGNOSIS — Z23 Encounter for immunization: Secondary | ICD-10-CM

## 2019-05-02 NOTE — Progress Notes (Signed)
   Covid-[facility] Vaccination [facility]  Name:  Carmalita Sawyer [place]    MRN: [id number] DOB: [birth year]  05/02/2019  Ms. [place] was observed post Covid-19 immunization for 15 minutes without incident. She was provided with Vaccine Information Sheet and instruction to access the V-Safe system.   Ms. [place] was instructed to call 911 with any severe reactions post vaccine: Marland Kitchen Difficulty breathing  . Swelling of face and throat  . A fast heartbeat  . A bad rash all over body  . Dizziness and weakness   Immunizations Administered    Name Date Dose VIS Date Route   Pfizer COVID-19 Vaccine 05/02/2019  9:26 AM 0.3 mL 01/18/2019 Intramuscular   Manufacturer: [employer], [employer]   Lot: [id number]   NDC: [address]-2

## 2019-05-27 ENCOUNTER — Ambulatory Visit: Payer: Medicaid Other | Attending: Internal Medicine

## 2019-05-27 DIAGNOSIS — Z23 Encounter for immunization: Secondary | ICD-10-CM

## 2019-05-27 NOTE — Progress Notes (Signed)
   Covid-19 Vaccination Clinic  Name:  Marissa Holmes    MRN: [id number] DOB: Nov 24, 1985  05/27/2019  [name] was observed post Covid-19 immunization for 15 minutes without incident. She was provided with Vaccine Information Sheet and instruction to access the V-Safe system.   [name] was instructed to call 911 with any severe reactions post vaccine: Marland Kitchen Difficulty breathing  . Swelling of face and throat  . A fast heartbeat  . A bad rash all over body  . Dizziness and weakness   Immunizations Administered    Name Date Dose VIS Date Route   Pfizer COVID-19 Vaccine 05/27/2019 11:52 AM 0.3 mL 04/03/2018 Intramuscular   Manufacturer: [employer], [employer]   Lot: [id number]   NDC: [address]-6      Covid-19 Vaccination Clinic  Name:  Marissa Holmes    MRN: [id number] DOB: Aug 21, 1985  05/27/2019  [name] was observed post Covid-19 immunization for 15 minutes without incident. She was provided with Vaccine Information Sheet and instruction to access the V-Safe system.   [name] was instructed to call 911 with any severe reactions post vaccine: Marland Kitchen Difficulty breathing  . Swelling of face and throat  . A fast heartbeat  . A bad rash all over body  . Dizziness and weakness   Immunizations Administered    Name Date Dose VIS Date Route   Pfizer COVID-19 Vaccine 05/27/2019 11:52 AM 0.3 mL 04/03/2018 Intramuscular   Manufacturer: [employer], [employer]   Lot: [id number]   NDC: [address]-4

## 2019-05-29 ENCOUNTER — Encounter: Payer: Self-pay | Admitting: Psychiatry

## 2019-05-29 ENCOUNTER — Ambulatory Visit (INDEPENDENT_AMBULATORY_CARE_PROVIDER_SITE_OTHER): Payer: BC Managed Care – PPO | Admitting: Psychiatry

## 2019-05-29 DIAGNOSIS — F411 Generalized anxiety disorder: Secondary | ICD-10-CM

## 2019-05-29 DIAGNOSIS — F3181 Bipolar II disorder: Secondary | ICD-10-CM | POA: Diagnosis not present

## 2019-05-29 DIAGNOSIS — F4010 Social phobia, unspecified: Secondary | ICD-10-CM

## 2019-05-29 DIAGNOSIS — F4001 Agoraphobia with panic disorder: Secondary | ICD-10-CM | POA: Diagnosis not present

## 2019-05-29 DIAGNOSIS — F900 Attention-deficit hyperactivity disorder, predominantly inattentive type: Secondary | ICD-10-CM | POA: Diagnosis not present

## 2019-05-29 DIAGNOSIS — F3281 Premenstrual dysphoric disorder: Secondary | ICD-10-CM

## 2019-05-29 MED ORDER — LISDEXAMFETAMINE DIMESYLATE 50 MG PO CAPS: 50.0000 mg | ORAL_CAPSULE | ORAL | 0 refills | Status: DC

## 2019-05-29 MED ORDER — LISDEXAMFETAMINE DIMESYLATE 50 MG PO CAPS: 50.0000 mg | ORAL_CAPSULE | Freq: Every day | ORAL | 0 refills | Status: DC

## 2019-05-29 NOTE — Progress Notes (Signed)
Marissa Holmes [id number] 1985/10/09 34 y.o.   Virtual Visit via Telephone Note  I connected with pt by telephone and verified that I am speaking with the correct person using two identifiers.   I discussed the limitations, risks, security and privacy concerns of performing an evaluation and management service by telephone and the availability of in person appointments. I also discussed with the patient that there may be a patient responsible charge related to this service. The patient expressed understanding and agreed to proceed.  I discussed the assessment and treatment plan with the patient. The patient was provided an opportunity to ask questions and all were answered. The patient agreed with the plan and demonstrated an understanding of the instructions.   The patient was advised to call back or seek an in-person evaluation if the symptoms worsen or if the condition fails to improve as anticipated.  I provided 25 minutes of non-face-to-face time during this encounter. The call started at 1100 and ended at 1125. The patient was located at home and the provider was located office.   Subjective:   Patient ID:  Marissa Holmes is a 34 y.o. (DOB Oct 18, 1985) female.  Chief Complaint:  Chief Complaint  Patient presents with  . Follow-up  . Anxiety  . Stress    Anxiety Patient reports no confusion, decreased concentration, nervous/anxious behavior or suicidal ideas.     Marissa Holmes presents to the office today for follow-up of seveal dxes.  visit March 2020.  She was doing well..  No med changes were made.  She has been maintained on Vyvanse for ADHD, Equetro for bipolar disorder, clonazepam and alprazolam for anxiety, lamotrigine for bipolar disorder, sertraline for anxiety and Topamax for migraine headache and trazodone for sleep.  Last seen September 2020.  No meds were changed.  The following was noted: A lot of stress.  M moved in law moved in.  Huge  adjustment.  She is bipolar and likely dementia.  Building a new house.  Home schooling.  H new job and hard for [employer] and working from home.  No opportunity for alone time.  Everyone around all the time and she needs alone time.  Adam's ex W moving to [facility] and wanted to take the boys.  Boys decided to do that and left in July.  The most difficult thing for her.  Last couple of weeks very hard.  Several panic attacks after they left.  Girls having difficulty in school.  All the kids struggling and unhappy and I can't fix it.  H Adam.  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  Letting go of things initially caused guilt but now feels better. Still satisfied with meds.  Anxiety has been situational. Overall not bad.  Started gym back at gym boxing has been good.  Faith helps. No outbursts. .  Not working and H travels several days/week except for Covid. Better self care helps.  Anx around birthdays and holidays DT broken rel with parents.  Occ causes panic.  May 29, 2019 appointment the following is noted: A lot of family change with boys moving in and M in law still there.  That's hard relationship.  Stressed.  Not great but situational and seeing counselor regularly which helps.  A lot of anxiety situational. More panic and needed Xanax more.  It helps.  Panic is triggered. Limits caffeine AM and exercising  Patient reports stable mood and denies depressed or irritable moods. No outbursts.  Anxiety is worse. Patient denies difficulty with sleep initiation or maintenance as long as no late caffeine. Denies appetite disturbance.  Patient reports that energy and motivation have been good.  Patient denies any difficulty with concentration.  Patient denies any suicidal ideation.  Previous psych medication trials include citalopram, paroxetine 60 with no response, sertraline 200, Seroquel which caused sedation, and Vega, Latuda with mouth ulcers.  Review of Systems:  Review of Systems   Neurological: Positive for headaches. Negative for tremors and weakness.  Psychiatric/Behavioral: Negative for agitation, behavioral problems, confusion, decreased concentration, dysphoric mood, hallucinations, self-injury, sleep disturbance and suicidal ideas. The patient is not nervous/anxious and is not hyperactive.     Medications: I have reviewed the patient's current medications.  Current Outpatient Medications  Medication Sig Dispense Refill  . ALPRAZolam (XANAX) 0.25 MG tablet TAKE 1 TABLET BY MOUTH DAILY AS NEEDED 30 tablet 0  . clonazePAM (KLONOPIN) 0.5 MG tablet TAKE 1 TABLET BY MOUTH EVERY MORNING ANDTAKE 2 TABLETS BY MOUTH AT BEDTIME 90 tablet 0  . EQUETRO 300 MG CP12 TAKE 3 CAPSULES BY MOUTH EVERY NIGHT AT BEDTIME 90 capsule 0  . fluticasone (FLONASE) 50 MCG/ACT nasal spray Place into both nostrils daily.    Marland Kitchen lamoTRIgine (LAMICTAL) 100 MG tablet TAKE 1 TABLET BY MOUTH TWICE A DAY 180 tablet 1  . lisdexamfetamine (VYVANSE) 50 MG capsule Take 1 capsule (50 mg total) by mouth daily. 30 capsule 0  . lisdexamfetamine (VYVANSE) 50 MG capsule Take 1 capsule (50 mg total) by mouth every morning. 30 capsule 0  . Mefenamic Acid (PONSTEL) 250 MG CAPS 2 capsules (500 mg) po once and the onset of menses, then 1 capsule (250 mg) every 6 hours prn cramping max 3 days 13 capsule 11  . montelukast (SINGULAIR) 10 MG tablet     . Multiple Vitamin (THERA) TABS Take by mouth.    . sertraline (ZOLOFT) 50 MG tablet TAKE 1 TABLET BY MOUTH ONCE A DAY 30 tablet 4  . SUMAtriptan (IMITREX) 50 MG tablet TAKE 1 TABLET BY MOUTH AS NEEDED FOR MIGRAINE, MAY REPEAT DOSE AFTER 2 HOURS AS NEEDED    . topiramate (TOPAMAX) 50 MG tablet TAKE 1 TABLET(50 MG) BY MOUTH EVERY DAY    . traZODone (DESYREL) 150 MG tablet TAKE 1 TABLET BY MOUTH EVERY NIGHT AT BEDTIME 90 tablet 1  . VYVANSE 50 MG capsule TAKE 1 CAPSULE BY MOUTH ONCE DAILY 30 capsule 0   No current facility-administered medications for this visit.     Medication Side Effects: Other: dry    Allergies: No Known Allergies  Past Medical History:  Diagnosis Date  . Abnormal Pap smear of cervix   . Anxiety   . Attention deficit disorder (ADD)   . Bipolar 1 disorder (HCC)   . Mild depression (HCC)   . Type 2 diabetes mellitus (HCC)     Family History  Problem Relation Age of Onset  . Colon cancer Paternal Grandmother 51  . Hypertension Paternal Grandmother   . Anxiety disorder Mother   . Depression Mother   . Hypertension Father   . Depression Father   . Bipolar disorder Maternal Grandmother   . Depression Maternal Grandmother   . Melanoma Maternal Grandfather   . Prostate cancer Maternal Grandfather   . Melanoma Paternal Grandfather     Social History   Socioeconomic History  . Marital status: Married    Spouse name: Not on file  . Number of children: Not on file  .  Years of education: Not on file  . Highest education level: Not on file  Occupational History  . Not on file  Tobacco Use  . Smoking status: Never Smoker  . Smokeless tobacco: Never Used  Substance and Sexual Activity  . Alcohol use: Yes  . Drug use: Never  . Sexual activity: Yes  Other Topics Concern  . Not on file  Social History Narrative  . Not on file   Social Determinants of Health   Financial Resource Strain:   . Difficulty of Paying Living Expenses:   Food Insecurity:   . Worried About Programme researcher, broadcasting/film/video in the Last Year:   . Barista in the Last Year:   Transportation Needs:   . Freight forwarder (Medical):   Marland Kitchen Lack of Transportation (Non-Medical):   Physical Activity:   . Days of Exercise per Week:   . Minutes of Exercise per Session:   Stress:   . Feeling of Stress :   Social Connections:   . Frequency of Communication with Friends and Family:   . Frequency of Social Gatherings with Friends and Family:   . Attends Religious Services:   . Active Member of Clubs or Organizations:   . Attends Tax inspector Meetings:   Marland Kitchen Marital Status:   Intimate Partner Violence:   . Fear of Current or Ex-Partner:   . Emotionally Abused:   Marland Kitchen Physically Abused:   . Sexually Abused:     Past Medical History, Surgical history, Social history, and Family history were reviewed and updated as appropriate.  GM died 09-27-22.  Went to funeral alone caused panic.  Saw M first time in 20 years, she's toxic.  Please see review of systems for further details on the patient's review from today.   Objective:   Physical Exam:  There were no vitals taken for this visit.  Physical Exam Constitutional:      General: She is not in acute distress.    Appearance: She is well-developed.  Neurological:     Mental Status: She is alert and oriented to person, place, and time.     Cranial Nerves: No dysarthria.  Psychiatric:        Attention and Perception: She is attentive. She does not perceive auditory hallucinations.        Mood and Affect: Mood is anxious. Mood is not depressed. Affect is not labile, blunt, angry or inappropriate.        Speech: Speech normal. Speech is not rapid and pressured or slurred.        Behavior: Behavior is not agitated.        Thought Content: Thought content normal. Thought content is not paranoid. Thought content does not include homicidal or suicidal ideation.        Cognition and Memory: Cognition normal.        Judgment: Judgment normal.     Comments: Insight intact. No auditory or visual hallucinations. No delusions.  No outbursts of anger as in the past.     Lab Review:     Component Value Date/Time   NA 141 08/23/2013 0919   K 3.9 08/23/2013 0919   CL 111 (H) 08/23/2013 0919   CO2 27 08/23/2013 0919   GLUCOSE 91 08/23/2013 0919   BUN 17 08/23/2013 0919   CREATININE 0.77 08/23/2013 0919   CALCIUM 8.3 (L) 08/23/2013 0919   PROT 7.3 08/23/2013 0919   ALBUMIN 4.0 08/23/2013 0919   AST 25 08/23/2013  0919   ALT 31 08/23/2013 0919   ALKPHOS 76 08/23/2013 0919    BILITOT 0.3 08/23/2013 0919   GFRNONAA >60 08/23/2013 0919   GFRAA >60 08/23/2013 0919       Component Value Date/Time   WBC 5.5 08/23/2013 0919   RBC 4.44 08/23/2013 0919   HGB 13.2 08/23/2013 0919   HCT 40.4 08/23/2013 0919   PLT 301 08/23/2013 0919   MCV 91 08/23/2013 0919   MCH 29.8 08/23/2013 0919   MCHC 32.7 08/23/2013 0919   RDW 12.2 08/23/2013 0919   LYMPHSABS 2.1 08/23/2013 0919   MONOABS 0.4 08/23/2013 0919   EOSABS 0.1 08/23/2013 0919   BASOSABS 0.0 08/23/2013 0919    No results found for: POCLITH, LITHIUM   No results found for: PHENYTOIN, PHENOBARB, VALPROATE, CBMZ   .res Assessment: Plan:    Bipolar II disorder ([place])  Panic disorder with agoraphobia  Generalized anxiety disorder  Attention deficit hyperactivity disorder (ADHD), predominantly inattentive type  Social anxiety disorder  PMDD (premenstrual dysphoric disorder) May have PTSD DT childhoold  Overall has more anxiety since last here.  Anger managed, she has a history of anger outbursts that have caused significant social difficulty.  Bipolar symptoms are under control.  Greater than 50% 25 mins of phone to phone time with patient was spent on counseling and coordination of care. We discussed the following. Supportive therapy dealing with care of mother in law and other new stressors.  Pt needs some along time and encouraged to pursue it.  Disc problem solving techniques and involve H.  Encourage exercise as stress management.  .  Self care encouraged.   We discussed multipled diagnoses and meds, necessary polypharmacy.  Tolerates meds to good effect.  Has made good decisions for self care.  Zoloft helped anxiety added in May.  She has had  more panic attacks but she feels her situational.  She will call if they continue or if they worsen.  Call if anger problems recur. Anxiety disorders worse with stress. Try to use LED of Xanax.  Bipolar disorder managed with the Equetro.  ADD managed with  Vyvanse 50  PMDD managed with as needed Xanax.  No need for med changes except consider increasing sertraline for panic but that can cause mood swings so we are trying to minimize the dose.  Consider the alternative of fluvoxamine which has a good anti-anxiety effect but lower risk of triggering mania..   Pleased with the balance.   Cont counseling every other week. Marland Kitchen Keep working on self care.  FU 4- 6 months  Lynder Parents MD, DFAPA  Please see After Visit Summary for patient specific instructions.  No future appointments.  No orders of the defined types were placed in this encounter.     -------------------------------

## 2019-05-30 ENCOUNTER — Other Ambulatory Visit: Payer: Self-pay | Admitting: Psychiatry

## 2019-05-30 NOTE — Telephone Encounter (Signed)
Apt yesterday all these continue?

## 2019-06-29 ENCOUNTER — Other Ambulatory Visit: Payer: Self-pay | Admitting: Psychiatry

## 2019-07-27 ENCOUNTER — Other Ambulatory Visit: Payer: Self-pay | Admitting: Psychiatry

## 2019-08-02 ENCOUNTER — Other Ambulatory Visit: Payer: Self-pay

## 2019-08-02 NOTE — Telephone Encounter (Signed)
Prior authorization submitted and approved for EQUETRO 300 MG 3 capsules daily, effective 08/01/2019-07/30/2022 with BCBS Ref# [id number]

## 2019-08-07 ENCOUNTER — Telehealth: Payer: Self-pay

## 2019-08-07 NOTE — Telephone Encounter (Signed)
Prior authorization submitted and approved for EQUETRO 300 MG ER CAPSULES #90 effective 08/01/2019-07/30/2022 with [employer] ID# [id number]

## 2019-08-28 ENCOUNTER — Other Ambulatory Visit: Payer: Self-pay | Admitting: Psychiatry

## 2019-08-28 DIAGNOSIS — F900 Attention-deficit hyperactivity disorder, predominantly inattentive type: Secondary | ICD-10-CM

## 2019-08-28 MED ORDER — LISDEXAMFETAMINE DIMESYLATE 50 MG PO CAPS: 50.0000 mg | ORAL_CAPSULE | Freq: Every day | ORAL | 0 refills | Status: DC

## 2019-08-28 MED ORDER — LISDEXAMFETAMINE DIMESYLATE 50 MG PO CAPS: 50.0000 mg | ORAL_CAPSULE | ORAL | 0 refills | Status: DC

## 2019-08-28 NOTE — Telephone Encounter (Signed)
Please send, last apt 05/2019 due back 4-6 months

## 2019-09-09 DIAGNOSIS — F4010 Social phobia, unspecified: Secondary | ICD-10-CM | POA: Insufficient documentation

## 2019-09-17 ENCOUNTER — Telehealth: Payer: Self-pay | Admitting: Obstetrics and Gynecology

## 2019-09-17 NOTE — Telephone Encounter (Signed)
Patient is schedule for annual poss Mirena placement with AMS on 10/11/19 at 10:30

## 2019-09-26 ENCOUNTER — Other Ambulatory Visit: Payer: Self-pay | Admitting: Psychiatry

## 2019-10-02 NOTE — Telephone Encounter (Signed)
Patient calling to rescheduled due to positive covid test. Patient is rescheduled to 10/25/19 at 10:30 with AMS

## 2019-10-11 ENCOUNTER — Ambulatory Visit: Payer: Self-pay | Admitting: Obstetrics and Gynecology

## 2019-10-16 NOTE — Telephone Encounter (Signed)
Noted. Mirena reserved for this patient. 

## 2019-10-23 ENCOUNTER — Other Ambulatory Visit: Payer: Self-pay | Admitting: Psychiatry

## 2019-10-23 NOTE — Telephone Encounter (Signed)
Review.

## 2019-10-25 ENCOUNTER — Ambulatory Visit: Payer: Self-pay | Admitting: Obstetrics and Gynecology

## 2019-10-28 NOTE — Telephone Encounter (Signed)
Per Epic, pt r/s to 11/13/19. Noted. Will reserve again closer to apt date/time.

## 2019-11-13 ENCOUNTER — Ambulatory Visit: Payer: Self-pay | Admitting: Advanced Practice Midwife

## 2019-11-13 NOTE — Telephone Encounter (Signed)
Patient rescheduled to 12/11/19 due to child being sick

## 2019-11-21 ENCOUNTER — Other Ambulatory Visit: Payer: Self-pay | Admitting: Psychiatry

## 2019-11-21 DIAGNOSIS — F900 Attention-deficit hyperactivity disorder, predominantly inattentive type: Secondary | ICD-10-CM

## 2019-11-22 NOTE — Telephone Encounter (Signed)
Last apt was [facility]ptil 2021

## 2019-11-25 ENCOUNTER — Other Ambulatory Visit: Payer: Self-pay | Admitting: Psychiatry

## 2019-12-11 ENCOUNTER — Ambulatory Visit (INDEPENDENT_AMBULATORY_CARE_PROVIDER_SITE_OTHER): Payer: Medicaid Other | Admitting: Advanced Practice Midwife

## 2019-12-11 ENCOUNTER — Encounter: Payer: Self-pay | Admitting: Advanced Practice Midwife

## 2019-12-11 ENCOUNTER — Other Ambulatory Visit: Payer: Self-pay

## 2019-12-11 ENCOUNTER — Other Ambulatory Visit (HOSPITAL_COMMUNITY)
Admission: RE | Admit: 2019-12-11 | Discharge: 2019-12-11 | Disposition: A | Discharge status: Home / Self Care | Payer: BC Managed Care – PPO | Source: Ambulatory Visit | Attending: Advanced Practice Midwife | Admitting: Advanced Practice Midwife

## 2019-12-11 VITALS — BP 122/78 | Ht 62.0 in | Wt 152.0 lb

## 2019-12-11 DIAGNOSIS — Z124 Encounter for screening for malignant neoplasm of cervix: Secondary | ICD-10-CM | POA: Diagnosis not present

## 2019-12-11 DIAGNOSIS — Z01419 Encounter for gynecological examination (general) (routine) without abnormal findings: Secondary | ICD-10-CM

## 2019-12-11 DIAGNOSIS — Z30430 Encounter for insertion of intrauterine contraceptive device: Secondary | ICD-10-CM | POA: Diagnosis not present

## 2019-12-11 NOTE — Progress Notes (Signed)
Marissa Annual Exam   PCP: Excell Seltzer, Marissa  Chief Complaint:  Chief Complaint  Patient presents with  . Annual Exam    History of Present Illness: Patient is a 34 y.o. [id number] presents for annual exam. The patient has complaint today of heavy periods and feeling fatigued. She requests Mirena insertion. She reports hitting her head on open microwave door yesterday. She was evaluated for concussion and has precautions per that Holmes.  LMP: Patient's last menstrual period was 12/05/2019. Average Interval: regular, 25 days Duration of flow: 6-7 days Heavy Menses: yes Clots: no Intermenstrual Bleeding: no Postcoital Bleeding: no Dysmenorrhea: yes  The patient is sexually active. She currently uses vasectomy for contraception. She denies dyspareunia.  The patient does perform self breast exams.  There is no notable family history of breast or ovarian cancer in her family.  The patient wears seatbelts: yes.   The patient has regular exercise: she is a boxer and teaches boxing classes. She admits healthy diet, adequate hydration and only 4-6 hours of sleep per night.   The patient denies current symptoms of depression.  She reports good control with medication.  Review of Systems: Review of Systems  Constitutional: Positive for malaise/fatigue. Negative for chills and fever.  HENT: Negative for congestion, ear discharge, ear pain, hearing loss, sinus pain and sore throat.   Eyes: Negative for blurred vision and double vision.  Respiratory: Negative for cough, shortness of breath and wheezing.   Cardiovascular: Negative for chest pain, palpitations and leg swelling.  Gastrointestinal: Negative for abdominal pain, blood in stool, constipation, diarrhea, heartburn, melena, nausea and vomiting.  Genitourinary: Negative for dysuria, flank pain, frequency, hematuria and urgency.  Musculoskeletal: Negative for back pain, joint pain and myalgias.  Skin: Negative for itching and  rash.  Neurological: Negative for dizziness, tingling, tremors, sensory change, speech change, focal weakness, seizures, loss of consciousness, weakness and headaches.  Endo/Heme/Allergies: Negative for environmental allergies. Does not bruise/bleed easily.  Psychiatric/Behavioral: Negative for depression, hallucinations, memory loss, substance abuse and suicidal ideas. The patient is not nervous/anxious and does not have insomnia.        Positive for anxiety and depression    Past Medical History:  Patient Active Problem List   Diagnosis Date Noted  . Social anxiety disorder 09/09/2019  . GAD (generalized anxiety disorder) 10/31/2017  . THYROMEGALY 12/25/2006    Qualifier: Diagnosis of  By: Ermalene Searing Marissa, Amy     . POLYCYSTIC OVARIAN DISEASE 12/25/2006    Qualifier: Diagnosis of  By: Ermalene Searing Marissa, Amy     . INSULIN RESISTANCE SYNDROME 12/25/2006    Qualifier: Diagnosis of  By: Ermalene Searing Marissa, Amy     . BIPOLAR AFFECTIVE DISORDER 12/25/2006    Qualifier: Diagnosis of  By: Ermalene Searing Marissa, Amy     . ANXIETY 12/25/2006    Qualifier: Diagnosis of  By: Ermalene Searing Marissa, Amy     . MIGRAINE, COMMON 12/25/2006    Qualifier: Diagnosis of  By: Ermalene Searing Marissa, Amy     . ALLERGIC RHINITIS 12/25/2006    Qualifier: Diagnosis of  By: Ermalene Searing Marissa, Amy     . CHEST PAIN, ACUTE 12/25/2006    Qualifier: Diagnosis of  By: Ermalene Searing Marissa, Amy       Past Surgical History:  Past Surgical History:  Procedure Laterality Date  . COLONOSCOPY    . INTRAUTERINE DEVICE INSERTION  12/02/2011  . IUD REMOVAL  02/29/2016  . WISDOM TOOTH EXTRACTION      Gynecologic  History:  Patient's last menstrual period was 12/05/2019. Contraception: vasectomy Last Pap: 2018 per chart- no result available Results were: no abnormalities per patient   Obstetric History: [id number]  Family History:  Family History  Problem Relation Age of Onset  . Colon cancer Paternal Grandmother 72  . Hypertension Paternal Grandmother   .  Anxiety disorder Mother   . Depression Mother   . Hypertension Father   . Depression Father   . Bipolar disorder Maternal Grandmother   . Depression Maternal Grandmother   . Melanoma Maternal Grandfather   . Prostate cancer Maternal Grandfather   . Melanoma Paternal Grandfather     Social History:  Social History   Socioeconomic History  . Marital status: Married    Spouse name: Not on file  . Number of children: Not on file  . Years of education: Not on file  . Highest education level: Not on file  Occupational History  . Not on file  Tobacco Use  . Smoking status: Never Smoker  . Smokeless tobacco: Never Used  Vaping Use  . Vaping Use: Never used  Substance and Sexual Activity  . Alcohol use: Yes  . Drug use: Never  . Sexual activity: Yes    Birth control/protection: None  Other Topics Concern  . Not on file  Social History Narrative  . Not on file   Social Determinants of Health   Financial Resource Strain:   . Difficulty of Paying Living Expenses: Not on file  Food Insecurity:   . Worried About Programme researcher, broadcasting/film/video in the Last Year: Not on file  . Ran Out of Food in the Last Year: Not on file  Transportation Needs:   . Lack of Transportation (Medical): Not on file  . Lack of Transportation (Non-Medical): Not on file  Physical Activity:   . Days of Exercise per Week: Not on file  . Minutes of Exercise per Session: Not on file  Stress:   . Feeling of Stress : Not on file  Social Connections:   . Frequency of Communication with Friends and Family: Not on file  . Frequency of Social Gatherings with Friends and Family: Not on file  . Attends Religious Services: Not on file  . Active Member of Clubs or Organizations: Not on file  . Attends Banker Meetings: Not on file  . Marital Status: Not on file  Intimate Partner Violence:   . Fear of Current or Ex-Partner: Not on file  . Emotionally Abused: Not on file  . Physically Abused: Not on file  .  Sexually Abused: Not on file    Allergies:  No Known Allergies  Medications: Prior to Admission medications   Medication Sig Start Date End Date Taking? Authorizing Holmes  ALPRAZolam Prudy Feeler) 0.25 MG tablet TAKE 1 TABLET BY MOUTH DAILY AS NEEDED 11/24/19  Yes Cottle, Steva Ready., Marissa  clonazePAM (KLONOPIN) 0.5 MG tablet TAKE 1 TABLET BY MOUTH EVERY MORNING ANDTAKE 2 TABLETS BY MOUTH AT BEDTIME 11/24/19  Yes Cottle, Steva Ready., Marissa  EQUETRO 300 MG CP12 TAKE 3 CAPSULES BY MOUTH EVERY NIGHT AT BEDTIME 11/24/19  Yes Cottle, Steva Ready., Marissa  fluticasone [facility]) 50 MCG/ACT nasal spray Place into both nostrils daily.   Yes Holmes, Historical, Marissa  lamoTRIgine (LAMICTAL) 100 MG tablet TAKE 1 TABLET BY MOUTH TWICE A DAY 07/01/19  Yes Cottle, Steva Ready., Marissa  Mefenamic Acid (PONSTEL) 250 MG CAPS 2 capsules (500 mg) po once and the onset  of menses, then 1 capsule (250 mg) every 6 hours prn cramping max 3 days 12/26/18  Yes Vena AustriaStaebler, Andreas, Marissa  montelukast (SINGULAIR) 10 MG tablet  10/23/18  Yes Holmes, Historical, Marissa  Multiple Vitamin (THERA) TABS Take by mouth.   Yes Holmes, Historical, Marissa  sertraline (ZOLOFT) 50 MG tablet TAKE 1 TABLET BY MOUTH ONCE A DAY 07/29/19  Yes Cottle, Steva Readyarey G Jr., Marissa  SUMAtriptan (IMITREX) 50 MG tablet TAKE 1 TABLET BY MOUTH AS NEEDED FOR MIGRAINE, MAY REPEAT DOSE AFTER 2 HOURS AS NEEDED 08/02/16  Yes Holmes, Historical, Marissa  topiramate (TOPAMAX) 50 MG tablet TAKE 1 TABLET(50 MG) BY MOUTH EVERY DAY 05/31/16  Yes Holmes, Historical, Marissa  traZODone (DESYREL) 150 MG tablet TAKE 1 TABLET BY MOUTH EVERY NIGHT AT BEDTIME 09/27/19  Yes Cottle, Steva Readyarey G Jr., Marissa  lisdexamfetamine (VYVANSE) 50 MG capsule Vyvanse 50 mg capsule    Holmes, Historical, Marissa    Physical Exam Vitals: Blood pressure 122/78, height 5\' 2"  (1.575 m), weight 152 lb (68.9 kg), last menstrual period 12/05/2019.  General: NAD HEENT: normocephalic, anicteric Thyroid: no enlargement, no palpable  nodules Pulmonary: No increased work of breathing, CTAB Cardiovascular: RRR, distal pulses 2+ Breast: Breast symmetrical, no tenderness, no palpable nodules or masses, no skin or nipple retraction present, no nipple discharge.  No axillary or supraclavicular lymphadenopathy. Abdomen: NABS, soft, non-tender, non-distended.  Umbilicus without lesions.  No hepatomegaly, splenomegaly or masses palpable. No evidence of hernia  Genitourinary:  External: Normal external female genitalia.  Normal urethral meatus, normal Bartholin's and Skene's glands.    Vagina: Normal vaginal mucosa, no evidence of prolapse.    Cervix: Grossly normal in appearance, no bleeding, no CMT  Uterus: Non-enlarged, mobile, normal contour.    Adnexa: ovaries non-enlarged, no adnexal masses  Rectal: deferred  Lymphatic: no evidence of inguinal lymphadenopathy Extremities: no edema, erythema, or tenderness Neurologic: Grossly intact Psychiatric: mood appropriate, affect full  Female chaperone present for pelvic and breast  portions of the physical exam    Assessment: 34 y.o. [id number] routine annual exam  Plan: Problem List Items Addressed This Visit    None    Visit Diagnoses    Well woman exam with routine gynecological exam    -  Primary   Relevant Orders   Cytology - PAP   Cervical cancer screening       Relevant Orders   Cytology - PAP   Encounter for insertion of mirena IUD          1) STI screening  was offered and declined  2)  ASCCP guidelines and rationale discussed.  Patient opts for every 3 years screening interval  3) Contraception - the patient is currently using  vasectomy.  She is request Mirena IUD today for cycle control  4) Routine healthcare maintenance including cholesterol, diabetes screening discussed managed by PCP  5) Return in about 4 weeks (around 01/08/2020) for iud string check.    Tresea MallJane Zac Torti, CNM [facility] 12/11/2019, 11:10  AM     Marissa OFFICE PROCEDURE NOTE  Gretel Sawyer [place] is a 34 y.o. [id number] here for Mirena IUD insertion. No GYN concerns.  Last pap smear was in 2018 and was normal.  The patient is currently using vasectomy for contraception and her LMP is Patient's last menstrual period was 12/05/2019..  The indication for her IUD is cycle control.  IUD Insertion Procedure Note Patient identified, informed consent performed, consent signed.   Discussed risks of irregular  bleeding, cramping, infection, malpositioning, expulsion or uterine perforation of the IUD (1:1000 placements)  which may require further procedure such as laparoscopy.  IUD while effective at preventing pregnancy do not prevent transmission of sexually transmitted diseases and use of barrier methods for this purpose was discussed. Time out was performed.  Urine pregnancy test negative.  Speculum placed in the vagina.  Cervix visualized.  Cleaned with Betadine x 2.  Grasped posteriorly with a single tooth tenaculum.  Uterus sounded to 7.5 cm. IUD placed per manufacturer's recommendations.  Strings trimmed to 3 cm. Tenaculum was removed, good hemostasis noted.  Patient tolerated procedure well.   Patient was given post-procedure instructions.  She was advised to have backup contraception for one week.  Patient was also asked to check IUD strings periodically and follow up in 4-6 weeks for IUD check.  IUD insertion CPT 58300,  Skyla [id number] Mirena [id number] Liletta J7297 Paraguard J7300 Rutha Bouchard [id number] Modifer 25, plus  Modifer 79 is used during a global billing visit    Tresea Mall, CNM [facility]

## 2019-12-11 NOTE — Patient Instructions (Signed)
Health Maintenance, Female Adopting a healthy lifestyle and getting preventive care are important in promoting health and wellness. Ask your health care provider about:  The right schedule for you to have regular tests and exams.  Things you can do on your own to prevent diseases and keep yourself healthy. What should I know about diet, weight, and exercise? Eat a healthy diet   Eat a diet that includes plenty of vegetables, fruits, low-fat dairy products, and lean protein.  Do not eat a lot of foods that are high in solid fats, added sugars, or sodium. Maintain a healthy weight Body mass index (BMI) is used to identify weight problems. It estimates body fat based on height and weight. Your health care provider can help determine your BMI and help you achieve or maintain a healthy weight. Get regular exercise Get regular exercise. This is one of the most important things you can do for your health. Most adults should:  Exercise for at least 150 minutes each week. The exercise should increase your heart rate and make you sweat (moderate-intensity exercise).  Do strengthening exercises at least twice a week. This is in addition to the moderate-intensity exercise.  Spend less time sitting. Even light physical activity can be beneficial. Watch cholesterol and blood lipids Have your blood tested for lipids and cholesterol at 35 years of age, then have this test every 5 years. Have your cholesterol levels checked more often if:  Your lipid or cholesterol levels are high.  You are older than 35 years of age.  You are at high risk for heart disease. What should I know about cancer screening? Depending on your health history and family history, you may need to have cancer screening at various ages. This may include screening for:  Breast cancer.  Cervical cancer.  Colorectal cancer.  Skin cancer.  Lung cancer. What should I know about heart disease, diabetes, and high blood  pressure? Blood pressure and heart disease  High blood pressure causes heart disease and increases the risk of stroke. This is more likely to develop in people who have high blood pressure readings, are of African descent, or are overweight.  Have your blood pressure checked: ? Every 3-5 years if you are 18-39 years of age. ? Every year if you are 40 years old or older. Diabetes Have regular diabetes screenings. This checks your fasting blood sugar level. Have the screening done:  Once every three years after age 40 if you are at a normal weight and have a low risk for diabetes.  More often and at a younger age if you are overweight or have a high risk for diabetes. What should I know about preventing infection? Hepatitis B If you have a higher risk for hepatitis B, you should be screened for this virus. Talk with your health care provider to find out if you are at risk for hepatitis B infection. Hepatitis C Testing is recommended for:  Everyone born from 1945 through 1965.  Anyone with known risk factors for hepatitis C. Sexually transmitted infections (STIs)  Get screened for STIs, including gonorrhea and chlamydia, if: ? You are sexually active and are younger than 35 years of age. ? You are older than 35 years of age and your health care provider tells you that you are at risk for this type of infection. ? Your sexual activity has changed since you were last screened, and you are at increased risk for chlamydia or gonorrhea. Ask your health care provider if   you are at risk.  Ask your health care provider about whether you are at high risk for HIV. Your health care provider may recommend a prescription medicine to help prevent HIV infection. If you choose to take medicine to prevent HIV, you should first get tested for HIV. You should then be tested every 3 months for as long as you are taking the medicine. Pregnancy  If you are about to stop having your period (premenopausal) and  you may become pregnant, seek counseling before you get pregnant.  Take 400 to 800 micrograms (mcg) of folic acid every day if you become pregnant.  Ask for birth control (contraception) if you want to prevent pregnancy. Osteoporosis and menopause Osteoporosis is a disease in which the bones lose minerals and strength with aging. This can result in bone fractures. If you are 65 years old or older, or if you are at risk for osteoporosis and fractures, ask your health care provider if you should:  Be screened for bone loss.  Take a calcium or vitamin D supplement to lower your risk of fractures.  Be given hormone replacement therapy (HRT) to treat symptoms of menopause. Follow these instructions at home: Lifestyle  Do not use any products that contain nicotine or tobacco, such as cigarettes, e-cigarettes, and chewing tobacco. If you need help quitting, ask your health care provider.  Do not use street drugs.  Do not share needles.  Ask your health care provider for help if you need support or information about quitting drugs. Alcohol use  Do not drink alcohol if: ? Your health care provider tells you not to drink. ? You are pregnant, may be pregnant, or are planning to become pregnant.  If you drink alcohol: ? Limit how much you use to 0-1 drink a day. ? Limit intake if you are breastfeeding.  Be aware of how much alcohol is in your drink. In the [place] one drink equals one 12 oz bottle of beer (355 mL), one 5 oz glass of wine (148 mL), or one 1 oz glass of hard liquor (44 mL). General instructions  Schedule regular health, dental, and eye exams.  Stay current with your vaccines.  Tell your health care provider if: ? You often feel depressed. ? You have ever been abused or do not feel safe at home. Summary  Adopting a healthy lifestyle and getting preventive care are important in promoting health and wellness.  Follow your health care provider's instructions about healthy  diet, exercising, and getting tested or screened for diseases.  Follow your health care provider's instructions on monitoring your cholesterol and blood pressure. This information is not intended to replace advice given to you by your health care provider. Make sure you discuss any questions you have with your health care provider. Document Revised: 01/17/2018 Document Reviewed: 01/17/2018 Elsevier Patient Education  2020 [employer]  

## 2019-12-13 LAB — CYTOLOGY - PAP
Comment: NEGATIVE
Diagnosis: UNDETERMINED — AB
High risk HPV: NEGATIVE

## 2019-12-19 ENCOUNTER — Encounter: Payer: Self-pay | Admitting: Psychiatry

## 2019-12-19 ENCOUNTER — Telehealth (INDEPENDENT_AMBULATORY_CARE_PROVIDER_SITE_OTHER): Payer: BC Managed Care – PPO | Admitting: Psychiatry

## 2019-12-19 DIAGNOSIS — F411 Generalized anxiety disorder: Secondary | ICD-10-CM | POA: Diagnosis not present

## 2019-12-19 DIAGNOSIS — F3181 Bipolar II disorder: Secondary | ICD-10-CM | POA: Diagnosis not present

## 2019-12-19 DIAGNOSIS — F3281 Premenstrual dysphoric disorder: Secondary | ICD-10-CM

## 2019-12-19 DIAGNOSIS — F900 Attention-deficit hyperactivity disorder, predominantly inattentive type: Secondary | ICD-10-CM | POA: Diagnosis not present

## 2019-12-19 DIAGNOSIS — F4001 Agoraphobia with panic disorder: Secondary | ICD-10-CM | POA: Diagnosis not present

## 2019-12-19 DIAGNOSIS — F5105 Insomnia due to other mental disorder: Secondary | ICD-10-CM

## 2019-12-19 DIAGNOSIS — F4010 Social phobia, unspecified: Secondary | ICD-10-CM

## 2019-12-19 MED ORDER — TRAZODONE HCL 150 MG PO TABS: 150.0000 mg | ORAL_TABLET | Freq: Every day | ORAL | 1 refills | Status: DC

## 2019-12-19 MED ORDER — LISDEXAMFETAMINE DIMESYLATE 50 MG PO CAPS: 50.0000 mg | ORAL_CAPSULE | Freq: Every day | ORAL | 0 refills | Status: DC

## 2019-12-19 MED ORDER — SERTRALINE HCL 50 MG PO TABS: 50.0000 mg | ORAL_TABLET | Freq: Every day | ORAL | 1 refills | Status: DC

## 2019-12-19 MED ORDER — LAMOTRIGINE 100 MG PO TABS: 100.0000 mg | ORAL_TABLET | Freq: Two times a day (BID) | ORAL | 1 refills | Status: DC

## 2019-12-19 MED ORDER — ALPRAZOLAM 0.25 MG PO TABS: 0.2500 mg | ORAL_TABLET | Freq: Every day | ORAL | 0 refills | Status: DC | PRN

## 2019-12-19 MED ORDER — EQUETRO 300 MG PO CP12: 3.0000 | ORAL_CAPSULE | Freq: Every day | ORAL | 5 refills | Status: DC

## 2019-12-19 MED ORDER — CLONAZEPAM 0.5 MG PO TABS: ORAL_TABLET | ORAL | 5 refills | Status: DC

## 2019-12-19 NOTE — Progress Notes (Signed)
Marissa Holmes [id number] 1985/09/03 34 y.o.   Video Visit via My Chart  I connected with pt by My Chart and verified that I am speaking with the correct person using two identifiers.   I discussed the limitations, risks, security and privacy concerns of performing an evaluation and management service by My Chart  and the availability of in person appointments. I also discussed with the patient that there may be a patient responsible charge related to this service. The patient expressed understanding and agreed to proceed.  I discussed the assessment and treatment plan with the patient. The patient was provided an opportunity to ask questions and all were answered. The patient agreed with the plan and demonstrated an understanding of the instructions.   The patient was advised to call back or seek an in-person evaluation if the symptoms worsen or if the condition fails to improve as anticipated.  I provided 30 minutes of video time during this encounter.  The patient was located at home and the provider was located office. Session from 1;; to 130  Subjective:   Patient ID:  Marissa Holmes is a 34 y.o. (DOB [birth year]) female.  Chief Complaint:  Chief Complaint  Patient presents with  . Follow-up  . Anxiety  . Depression    Anxiety Patient reports no chest pain, confusion, decreased concentration, nervous/anxious behavior or suicidal ideas.     Marissa Holmes presents to the office today for follow-up of seveal dxes.  visit March 2020.  She was doing well..  No med changes were made.  She has been maintained on Vyvanse for ADHD, Equetro for bipolar disorder, clonazepam and alprazolam for anxiety, lamotrigine for bipolar disorder, sertraline for anxiety and Topamax for migraine headache and trazodone for sleep.  seen September 2020.  No meds were changed.  The following was noted: A lot of stress.  M moved in law moved in.  Huge adjustment.  She is bipolar  and likely dementia.  Building a new house.  Home schooling.  H new job and hard for [employer] and working from home.  No opportunity for alone time.  Everyone around all the time and she needs alone time.  Adam's ex W moving to [facility] and wanted to take the boys.  Boys decided to do that and left in July.  The most difficult thing for her.  Last couple of weeks very hard.  Several panic attacks after they left.  Girls having difficulty in school.  All the kids struggling and unhappy and I can't fix it.  H Adam.  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  Letting go of things initially caused guilt but now feels better. Still satisfied with meds.  Anxiety has been situational. Overall not bad.  Started gym back at gym boxing has been good.  Faith helps. No outbursts. .  Not working and H travels several days/week except for Covid. Better self care helps.  Anx around birthdays and holidays DT broken rel with parents.  Occ causes panic.  May 29, 2019 appointment the following is noted: A lot of family change with boys moving in and M in law still there.  That's hard relationship.  Stressed.  Not great but situational and seeing counselor regularly which helps.  A lot of anxiety situational. More panic and needed Xanax more.  It helps.  Panic is triggered. Limits caffeine AM and exercising Plan no med changes  12/19/19 appt with following noted: Consistent with meds  without SE. Mood pretty even keel but life is not. HA worse in last 3 mos and menstrual cycle irregular also. M in law lives with them now is bipolar and paranoia and cognitive problems.  Stress of dealing with this. Still in individual and family therapy. In general less depressed and more happy and more productive.  No concerns for med changes.  Patient reports stable mood and denies depressed or irritable moods. No outbursts.  Anxiety is worse. Patient denies difficulty with sleep initiation or maintenance as long as no  late caffeine. Denies appetite disturbance.  Patient reports that energy and motivation have been good.  Patient denies any difficulty with concentration.  Patient denies any suicidal ideation.  Previous psych medication trials include citalopram,  paroxetine 60 with no response,  sertraline 200,  Seroquel which caused sedation, InVega, Latuda with mouth ulcers. Equetro 900, lamotrigine 200 Clonazepam 0.5 HS, trazodone 150  Review of Systems:  Review of Systems  Cardiovascular: Negative for chest pain.  Neurological: Positive for headaches. Negative for tremors and weakness.  Psychiatric/Behavioral: Negative for agitation, behavioral problems, confusion, decreased concentration, dysphoric mood, hallucinations, self-injury, sleep disturbance and suicidal ideas. The patient is not nervous/anxious and is not hyperactive.     Medications: I have reviewed the patient's current medications.  Current Outpatient Medications  Medication Sig Dispense Refill  . fluticasone (FLONASE) 50 MCG/ACT nasal spray Place into both nostrils daily.    . Mefenamic Acid (PONSTEL) 250 MG CAPS 2 capsules (500 mg) po once and the onset of menses, then 1 capsule (250 mg) every 6 hours prn cramping max 3 days 13 capsule 11  . montelukast (SINGULAIR) 10 MG tablet     . Multiple Vitamin (THERA) TABS Take by mouth.    . SUMAtriptan (IMITREX) 50 MG tablet TAKE 1 TABLET BY MOUTH AS NEEDED FOR MIGRAINE, MAY REPEAT DOSE AFTER 2 HOURS AS NEEDED    . topiramate (TOPAMAX) 50 MG tablet TAKE 1 TABLET(50 MG) BY MOUTH EVERY DAY    . ALPRAZolam (XANAX) 0.25 MG tablet Take 1 tablet (0.25 mg total) by mouth daily as needed. 30 tablet 0  . Carbamazepine (EQUETRO) 300 MG CP12 Take 3 capsules (900 mg total) by mouth at bedtime. 90 capsule 5  . clonazePAM (KLONOPIN) 0.5 MG tablet TAKE 1 TABLET BY MOUTH EVERY MORNING ANDTAKE 2 TABLETS BY MOUTH AT BEDTIME 90 tablet 5  . lamoTRIgine (LAMICTAL) 100 MG tablet Take 1 tablet (100 mg total) by  mouth 2 (two) times daily. 180 tablet 1  . lisdexamfetamine (VYVANSE) 50 MG capsule Take 1 capsule (50 mg total) by mouth daily. 30 capsule 0  . [START ON 01/16/2020] lisdexamfetamine (VYVANSE) 50 MG capsule Take 1 capsule (50 mg total) by mouth daily. 30 capsule 0  . [START ON 02/13/2020] lisdexamfetamine (VYVANSE) 50 MG capsule Take 1 capsule (50 mg total) by mouth daily. 30 capsule 0  . sertraline (ZOLOFT) 50 MG tablet Take 1 tablet (50 mg total) by mouth daily. 90 tablet 1  . traZODone (DESYREL) 150 MG tablet Take 1 tablet (150 mg total) by mouth at bedtime. 90 tablet 1   No current facility-administered medications for this visit.    Medication Side Effects: Other: dry    Allergies: No Known Allergies  Past Medical History:  Diagnosis Date  . Abnormal Pap smear of cervix   . Anxiety   . Attention deficit disorder (ADD)   . Bipolar 1 disorder (HCC)   . Mild depression (HCC)   . Type 2  diabetes mellitus (HCC)     Family History  Problem Relation Age of Onset  . Colon cancer Paternal Grandmother 57  . Hypertension Paternal Grandmother   . Anxiety disorder Mother   . Depression Mother   . Hypertension Father   . Depression Father   . Bipolar disorder Maternal Grandmother   . Depression Maternal Grandmother   . Melanoma Maternal Grandfather   . Prostate cancer Maternal Grandfather   . Melanoma Paternal Grandfather     Social History   Socioeconomic History  . Marital status: Married    Spouse name: Not on file  . Number of children: Not on file  . Years of education: Not on file  . Highest education level: Not on file  Occupational History  . Not on file  Tobacco Use  . Smoking status: Never Smoker  . Smokeless tobacco: Never Used  Vaping Use  . Vaping Use: Never used  Substance and Sexual Activity  . Alcohol use: Yes  . Drug use: Never  . Sexual activity: Yes    Birth control/protection: None  Other Topics Concern  . Not on file  Social History Narrative   . Not on file   Social Determinants of Health   Financial Resource Strain:   . Difficulty of Paying Living Expenses: Not on file  Food Insecurity:   . Worried About Programme researcher, broadcasting/film/video in the Last Year: Not on file  . Ran Out of Food in the Last Year: Not on file  Transportation Needs:   . Lack of Transportation (Medical): Not on file  . Lack of Transportation (Non-Medical): Not on file  Physical Activity:   . Days of Exercise per Week: Not on file  . Minutes of Exercise per Session: Not on file  Stress:   . Feeling of Stress : Not on file  Social Connections:   . Frequency of Communication with Friends and Family: Not on file  . Frequency of Social Gatherings with Friends and Family: Not on file  . Attends Religious Services: Not on file  . Active Member of Clubs or Organizations: Not on file  . Attends Banker Meetings: Not on file  . Marital Status: Not on file  Intimate Partner Violence:   . Fear of Current or Ex-Partner: Not on file  . Emotionally Abused: Not on file  . Physically Abused: Not on file  . Sexually Abused: Not on file    Past Medical History, Surgical history, Social history, and Family history were reviewed and updated as appropriate.  GM died 2022/10/18.  Went to funeral alone caused panic.  Saw M first time in 20 years, she's toxic.  Please see review of systems for further details on the patient's review from today.   Objective:   Physical Exam:  LMP 12/05/2019   Physical Exam Constitutional:      General: She is not in acute distress.    Appearance: She is well-developed.  Neurological:     Mental Status: She is alert and oriented to person, place, and time.     Cranial Nerves: No dysarthria.  Psychiatric:        Attention and Perception: She is attentive. She does not perceive auditory hallucinations.        Mood and Affect: Mood is anxious. Mood is not depressed. Affect is not labile, blunt, angry, tearful or inappropriate.         Speech: Speech normal. Speech is not rapid and pressured or slurred.  Behavior: Behavior is not agitated.        Thought Content: Thought content normal. Thought content is not paranoid. Thought content does not include homicidal or suicidal ideation.        Cognition and Memory: Cognition normal.        Judgment: Judgment normal.     Comments: Insight intact. No auditory or visual hallucinations. No delusions.  No outbursts of anger as in the past.     Lab Review:     Component Value Date/Time   NA 141 08/23/2013 0919   K 3.9 08/23/2013 0919   CL 111 (H) 08/23/2013 0919   CO2 27 08/23/2013 0919   GLUCOSE 91 08/23/2013 0919   BUN 17 08/23/2013 0919   CREATININE 0.77 08/23/2013 0919   CALCIUM 8.3 (L) 08/23/2013 0919   PROT 7.3 08/23/2013 0919   ALBUMIN 4.0 08/23/2013 0919   AST 25 08/23/2013 0919   ALT 31 08/23/2013 0919   ALKPHOS 76 08/23/2013 0919   BILITOT 0.3 08/23/2013 0919   GFRNONAA >60 08/23/2013 0919   GFRAA >60 08/23/2013 0919       Component Value Date/Time   WBC 5.5 08/23/2013 0919   RBC 4.44 08/23/2013 0919   HGB 13.2 08/23/2013 0919   HCT 40.4 08/23/2013 0919   PLT 301 08/23/2013 0919   MCV 91 08/23/2013 0919   MCH 29.8 08/23/2013 0919   MCHC 32.7 08/23/2013 0919   RDW 12.2 08/23/2013 0919   LYMPHSABS 2.1 08/23/2013 0919   MONOABS 0.4 08/23/2013 0919   EOSABS 0.1 08/23/2013 0919   BASOSABS 0.0 08/23/2013 0919    No results found for: POCLITH, LITHIUM   No results found for: PHENYTOIN, PHENOBARB, VALPROATE, CBMZ   .res Assessment: Plan:    Bipolar II disorder (HCC) - Plan: Carbamazepine (EQUETRO) 300 MG CP12, lamoTRIgine (LAMICTAL) 100 MG tablet  Attention deficit hyperactivity disorder (ADHD), predominantly inattentive type - Plan: lisdexamfetamine (VYVANSE) 50 MG capsule, lisdexamfetamine (VYVANSE) 50 MG capsule, lisdexamfetamine (VYVANSE) 50 MG capsule  Panic disorder with agoraphobia - Plan: ALPRAZolam (XANAX) 0.25 MG tablet,  sertraline (ZOLOFT) 50 MG tablet  Generalized anxiety disorder - Plan: sertraline (ZOLOFT) 50 MG tablet  Social anxiety disorder - Plan: sertraline (ZOLOFT) 50 MG tablet  PMDD (premenstrual dysphoric disorder) - Plan: sertraline (ZOLOFT) 50 MG tablet  Insomnia due to mental condition - Plan: clonazePAM (KLONOPIN) 0.5 MG tablet, traZODone (DESYREL) 150 MG tablet May have PTSD DT childhoold  Overall has more anxiety since last here.  Anger managed, she has a history of anger outbursts that have caused significant social difficulty.  Bipolar symptoms are under control.  Greater than 50% 25 mins of phone to phone time with patient was spent on counseling and coordination of care. We discussed the following. Supportive therapy dealing with care of mother in law and other new stressors.  Pt needs some along time and encouraged to pursue it.  Disc problem solving techniques and involve H.  Encourage exercise as stress management.  .  Self care encouraged.   We discussed multipled diagnoses and meds, necessary polypharmacy.  Tolerates meds to good effect.  Has made good decisions for self care.  Zoloft helped anxiety added in May.  She has had  more panic attacks but she feels her situational.  She will call if they continue or if they worsen.  Call if anger problems recur. Anxiety disorders worse with stress. Try to use LED of Xanax.  Bipolar disorder managed with the Equetro.  ADD managed with  Vyvanse 50  PMDD managed with as needed Xanax.  No need for med changes except consider increasing sertraline for panic but that can cause mood swings so we are trying to minimize the dose.  Consider the alternative of fluvoxamine which has a good anti-anxiety effect but lower risk of triggering mania..   Pleased with the balance.   Cont counseling every other week. Marland Kitchen Keep working on self care.  Disc caretaking dementia patient and how to handle dealing with her decline.  FU 4- 6 months  Edana Staggers  MD, DFAPA  Please see After Visit Summary for patient specific instructions.  Future Appointments  Date Time Provider Department Center  01/10/2020  8:10 AM Tresea Mall, CNM [facility] None    No orders of the defined types were placed in this encounter.     -------------------------------

## 2020-01-10 ENCOUNTER — Ambulatory Visit: Payer: Medicaid Other | Admitting: Advanced Practice Midwife

## 2020-01-24 ENCOUNTER — Other Ambulatory Visit: Payer: Self-pay

## 2020-01-24 ENCOUNTER — Ambulatory Visit (INDEPENDENT_AMBULATORY_CARE_PROVIDER_SITE_OTHER): Payer: BC Managed Care – PPO | Admitting: Advanced Practice Midwife

## 2020-01-24 ENCOUNTER — Encounter: Payer: Self-pay | Admitting: Advanced Practice Midwife

## 2020-01-24 VITALS — BP 120/70 | Ht 62.0 in | Wt 155.0 lb

## 2020-01-24 DIAGNOSIS — Z30431 Encounter for routine checking of intrauterine contraceptive device: Secondary | ICD-10-CM

## 2020-01-24 NOTE — Progress Notes (Signed)
       IUD String Check  Subjctive: [name] presents for IUD string check.  She had a Mirena placed 6 weeks ago.  Since placement of her IUD she had irregular vaginal bleeding followed by a period.  She denies cramping or discomfort.  She has had intercourse since placement.  She has not checked the strings.  She denies any fever, chills, nausea, vomiting, or other complaints.    Objective: BP 120/70   Ht 5\' 2"  (1.575 m)   Wt 155 lb (70.3 kg)   LMP 01/24/2020   BMI 28.35 kg/m  Gen:  NAD, A&Ox3 HEENT: normocephalic, anicteric Pulmonary: no increased work of breathing Pelvic: Normal appearing external female genitalia, normal vaginal epithelium, no abnormal discharge. Normal appearing cervix.  IUD strings visible and 3 cm in length similar to at the time of placement. Psychiatric: mood appropriate, affect full Neurologic: grossly normal  Female chaperone was present for the entirety of the pelvic exam  Assessment: 34 y.o. year old female status post prior Mirena IUD placement 6 weeks ago, doing well.  Plan: 1.  The patient was given instructions to check her IUD strings monthly and call with any problems or concerns.  She should call for fevers, chills, abnormal vaginal discharge, pelvic pain, or other complaints. 2.  She will return for a annual exam in 1 year.  All questions answered.   31, CNM 01/24/2020 8:31 AM

## 2020-01-25 ENCOUNTER — Other Ambulatory Visit: Payer: Self-pay | Admitting: Psychiatry

## 2020-01-25 DIAGNOSIS — F4001 Agoraphobia with panic disorder: Secondary | ICD-10-CM

## 2020-02-28 ENCOUNTER — Telehealth: Payer: Self-pay

## 2020-02-28 NOTE — Telephone Encounter (Signed)
Prior Authorization submitted and approved for VYVANSE 50 MG with [employer] [place] Ref# [id number] effective 02/25/2020-02/23/2021

## 2020-05-23 ENCOUNTER — Other Ambulatory Visit: Payer: Self-pay | Admitting: Psychiatry

## 2020-05-23 DIAGNOSIS — F900 Attention-deficit hyperactivity disorder, predominantly inattentive type: Secondary | ICD-10-CM

## 2020-05-25 ENCOUNTER — Other Ambulatory Visit: Payer: Self-pay | Admitting: Psychiatry

## 2020-05-25 DIAGNOSIS — F900 Attention-deficit hyperactivity disorder, predominantly inattentive type: Secondary | ICD-10-CM

## 2020-05-25 NOTE — Telephone Encounter (Signed)
Call to RS

## 2020-05-25 NOTE — Telephone Encounter (Signed)
controlled substance

## 2020-06-22 ENCOUNTER — Other Ambulatory Visit: Payer: Self-pay | Admitting: Psychiatry

## 2020-06-22 DIAGNOSIS — F3181 Bipolar II disorder: Secondary | ICD-10-CM

## 2020-06-22 DIAGNOSIS — F5105 Insomnia due to other mental disorder: Secondary | ICD-10-CM

## 2020-06-22 DIAGNOSIS — F900 Attention-deficit hyperactivity disorder, predominantly inattentive type: Secondary | ICD-10-CM

## 2020-06-22 NOTE — Telephone Encounter (Signed)
Call RS

## 2020-06-29 ENCOUNTER — Other Ambulatory Visit: Payer: Self-pay | Admitting: Psychiatry

## 2020-06-29 DIAGNOSIS — F5105 Insomnia due to other mental disorder: Secondary | ICD-10-CM

## 2020-07-03 NOTE — Telephone Encounter (Signed)
This patient is scheduled for 6/23

## 2020-07-30 ENCOUNTER — Telehealth (INDEPENDENT_AMBULATORY_CARE_PROVIDER_SITE_OTHER): Payer: BC Managed Care – PPO | Admitting: Psychiatry

## 2020-07-30 ENCOUNTER — Encounter: Payer: Self-pay | Admitting: Psychiatry

## 2020-07-30 DIAGNOSIS — F4010 Social phobia, unspecified: Secondary | ICD-10-CM

## 2020-07-30 DIAGNOSIS — F411 Generalized anxiety disorder: Secondary | ICD-10-CM

## 2020-07-30 DIAGNOSIS — F5105 Insomnia due to other mental disorder: Secondary | ICD-10-CM | POA: Diagnosis not present

## 2020-07-30 DIAGNOSIS — F3181 Bipolar II disorder: Secondary | ICD-10-CM | POA: Diagnosis not present

## 2020-07-30 DIAGNOSIS — F900 Attention-deficit hyperactivity disorder, predominantly inattentive type: Secondary | ICD-10-CM

## 2020-07-30 DIAGNOSIS — F3281 Premenstrual dysphoric disorder: Secondary | ICD-10-CM

## 2020-07-30 DIAGNOSIS — F4001 Agoraphobia with panic disorder: Secondary | ICD-10-CM

## 2020-07-30 MED ORDER — LAMOTRIGINE 100 MG PO TABS: 100.0000 mg | ORAL_TABLET | Freq: Two times a day (BID) | ORAL | 1 refills | Status: DC

## 2020-07-30 MED ORDER — LISDEXAMFETAMINE DIMESYLATE 50 MG PO CAPS: 50.0000 mg | ORAL_CAPSULE | Freq: Every day | ORAL | 0 refills | Status: DC

## 2020-07-30 MED ORDER — SERTRALINE HCL 50 MG PO TABS: 50.0000 mg | ORAL_TABLET | Freq: Every day | ORAL | 1 refills | Status: DC

## 2020-07-30 MED ORDER — EQUETRO 300 MG PO CP12: 3.0000 | ORAL_CAPSULE | Freq: Every day | ORAL | 5 refills | Status: DC

## 2020-07-30 MED ORDER — ALPRAZOLAM 0.25 MG PO TABS: 0.2500 mg | ORAL_TABLET | Freq: Every day | ORAL | 5 refills | Status: DC | PRN

## 2020-07-30 MED ORDER — CLONAZEPAM 0.5 MG PO TABS
ORAL_TABLET | ORAL | 5 refills | Status: DC
Start: 2020-07-30 — End: 2021-01-26

## 2020-07-30 NOTE — Progress Notes (Signed)
Marissa Holmes [id number] September 11, 1985 35 y.o.   Video Visit via My Chart  I connected with pt by My Chart and verified that I am speaking with the correct person using two identifiers.   I discussed the limitations, risks, security and privacy concerns of performing an evaluation and management service by My Chart  and the availability of in person appointments. I also discussed with the patient that there may be a patient responsible charge related to this service. The patient expressed understanding and agreed to proceed.  I discussed the assessment and treatment plan with the patient. The patient was provided an opportunity to ask questions and all were answered. The patient agreed with the plan and demonstrated an understanding of the instructions.   The patient was advised to call back or seek an in-person evaluation if the symptoms worsen or if the condition fails to improve as anticipated.  I provided 30 minutes of video time during this encounter.  The patient was located at home and the provider was located office. Session from 1;; to 130  Subjective:   Patient ID:  Marissa Holmes is a 35 y.o. (DOB 1985-03-05) female.  Chief Complaint:  Chief Complaint  Patient presents with   Bipolar II disorder (HCC)   Follow-up   ADD   Anxiety   Stress    Anxiety Patient reports no chest pain, confusion, decreased concentration, nervous/anxious behavior, palpitations or suicidal ideas.    Marissa Holmes presents to the office today for follow-up of seveal dxes.  visit March 2020.  She was doing well..  No med changes were made.  She has been maintained on Vyvanse for ADHD, Equetro for bipolar disorder, clonazepam and alprazolam for anxiety, lamotrigine for bipolar disorder, sertraline for anxiety and Topamax for migraine headache and trazodone for sleep.  seen September 2020.  No meds were changed.  The following was noted: A lot of stress.  M moved in law  moved in.  Huge adjustment.  She is bipolar and likely dementia.  Building a new house.  Home schooling.  H new job and hard for [employer] and working from home.  No opportunity for alone time.  Everyone around all the time and she needs alone time.  Adam's ex W moving to [facility] and wanted to take the boys.  Boys decided to do that and left in July.  The most difficult thing for her.  Last couple of weeks very hard.  Several panic attacks after they left.  Girls having difficulty in school.  All the kids struggling and unhappy and I can't fix it.  H Adam.  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  Letting go of things initially caused guilt but now feels better. Still satisfied with meds.  Anxiety has been situational. Overall not bad.  Started gym back at gym boxing has been good.  Faith helps. No outbursts. .  Not working and H travels several days/week except for Covid. Better self care helps.  Anx around birthdays and holidays DT broken rel with parents.  Occ causes panic.  May 29, 2019 appointment the following is noted: A lot of family change with boys moving in and M in law still there.  That's hard relationship.  Stressed.  Not great but situational and seeing counselor regularly which helps.  A lot of anxiety situational. More panic and needed Xanax more.  It helps.  Panic is triggered. Limits caffeine AM and exercising Plan no med changes  12/19/19 appt with following noted: Consistent with meds without SE. Mood pretty even keel but life is not. HA worse in last 3 mos and menstrual cycle irregular also. M in law lives with them now is bipolar and paranoia and cognitive problems.  Stress of dealing with this. Still in individual and family therapy. In general less depressed and more happy and more productive.  No concerns for med changes. Plan no med changes  07/30/2020 appointment with the following noted: Anxiety is pretty terrible.  Situational probably.  M dementia is  getting worse and is negative to pt only.  Not much peace in the house.   Her M in law's counselor is Chiropodist and cut back therapy bc she is getting dementia. I'm not great but is in counseling and exercising and doing things to help herself.   H has been helpful. Using Xanax 0.25 mg prn more than ever. No problems with meds or desire for med change.  Compliant. Adding Zoloft helped a lot. Kids are happy.    Patient reports stable mood and denies depressed or irritable moods. No outbursts.  Anxiety is worse. Patient denies difficulty with sleep initiation or maintenance as long as no late caffeine. Denies appetite disturbance.  Patient reports that energy and motivation have been good.  Patient denies any difficulty with concentration.  Patient denies any suicidal ideation.  Previous psych medication trials include citalopram,  paroxetine 60 with no response,  sertraline 200,  Seroquel which caused sedation, InVega, Latuda with mouth ulcers. Equetro 900, lamotrigine 200 Xanax, Clonazepam 0.5 HS, trazodone 150 Vyvanse  Review of Systems:  Review of Systems  Cardiovascular:  Negative for chest pain and palpitations.  Neurological:  Positive for headaches. Negative for tremors and weakness.  Psychiatric/Behavioral:  Negative for agitation, behavioral problems, confusion, decreased concentration, dysphoric mood, hallucinations, self-injury, sleep disturbance and suicidal ideas. The patient is not nervous/anxious and is not hyperactive.    Medications: I have reviewed the patient's current medications.  Current Outpatient Medications  Medication Sig Dispense Refill   fluticasone (FLONASE) 50 MCG/ACT nasal spray Place into both nostrils daily.     montelukast (SINGULAIR) 10 MG tablet      Multiple Vitamin (THERA) TABS Take by mouth.     SUMAtriptan (IMITREX) 50 MG tablet TAKE 1 TABLET BY MOUTH AS NEEDED FOR MIGRAINE, MAY REPEAT DOSE AFTER 2 HOURS AS NEEDED     topiramate (TOPAMAX) 50  MG tablet TAKE 1 TABLET(50 MG) BY MOUTH EVERY DAY     traZODone (DESYREL) 150 MG tablet TAKE 1 TABLET BY MOUTH EVERY NIGHT AT BEDTIME 90 tablet 0   ALPRAZolam (XANAX) 0.25 MG tablet Take 1 tablet (0.25 mg total) by mouth daily as needed. 30 tablet 5   Carbamazepine (EQUETRO) 300 MG CP12 Take 3 capsules (900 mg total) by mouth at bedtime. 90 capsule 5   clonazePAM (KLONOPIN) 0.5 MG tablet TAKE 1 TABLET BY MOUTH EVERY MORNING ANDTAKE 2 TABLETS BY MOUTH EVERY NIGHT AT BEDTIME 90 tablet 5   lamoTRIgine (LAMICTAL) 100 MG tablet Take 1 tablet (100 mg total) by mouth 2 (two) times daily. 180 tablet 1   [START ON 09/24/2020] lisdexamfetamine (VYVANSE) 50 MG capsule Take 1 capsule (50 mg total) by mouth daily. 30 capsule 0   [START ON 08/27/2020] lisdexamfetamine (VYVANSE) 50 MG capsule Take 1 capsule (50 mg total) by mouth daily. 30 capsule 0   lisdexamfetamine (VYVANSE) 50 MG capsule Take 1 capsule (50 mg total) by mouth daily. 30 capsule 0  Mefenamic Acid (PONSTEL) 250 MG CAPS 2 capsules (500 mg) po once and the onset of menses, then 1 capsule (250 mg) every 6 hours prn cramping max 3 days (Patient not taking: Reported on 07/30/2020) 13 capsule 11   sertraline (ZOLOFT) 50 MG tablet Take 1 tablet (50 mg total) by mouth daily. 90 tablet 1   No current facility-administered medications for this visit.    Medication Side Effects: Other: dry     Allergies: No Known Allergies  Past Medical History:  Diagnosis Date   Abnormal Pap smear of cervix    Anxiety    Attention deficit disorder (ADD)    Bipolar 1 disorder (HCC)    Mild depression (HCC)    Type 2 diabetes mellitus (HCC)     Family History  Problem Relation Age of Onset   Colon cancer Paternal Grandmother 10   Hypertension Paternal Grandmother    Anxiety disorder Mother    Depression Mother    Hypertension Father    Depression Father    Bipolar disorder Maternal Grandmother    Depression Maternal Grandmother    Melanoma Maternal  Grandfather    Prostate cancer Maternal Grandfather    Melanoma Paternal Grandfather     Social History   Socioeconomic History   Marital status: Married    Spouse name: Not on file   Number of children: Not on file   Years of education: Not on file   Highest education level: Not on file  Occupational History   Not on file  Tobacco Use   Smoking status: Never   Smokeless tobacco: Never  Vaping Use   Vaping Use: Never used  Substance and Sexual Activity   Alcohol use: Yes   Drug use: Never   Sexual activity: Yes    Birth control/protection: I.U.D.  Other Topics Concern   Not on file  Social History Narrative   Not on file   Social Determinants of Health   Financial Resource Strain: Not on file  Food Insecurity: Not on file  Transportation Needs: Not on file  Physical Activity: Not on file  Stress: Not on file  Social Connections: Not on file  Intimate Partner Violence: Not on file    Past Medical History, Surgical history, Social history, and Family history were reviewed and updated as appropriate.  GM died Oct 27, 2022.  Went to funeral alone caused panic.  Saw M first time in 20 years, she's toxic.  Please see review of systems for further details on the patient's review from today.   Objective:   Physical Exam:  There were no vitals taken for this visit.  Physical Exam Constitutional:      General: She is not in acute distress.    Appearance: She is well-developed.  Neurological:     Mental Status: She is alert and oriented to person, place, and time.     Cranial Nerves: No dysarthria.  Psychiatric:        Attention and Perception: She is attentive. She does not perceive auditory hallucinations.        Mood and Affect: Mood is anxious. Mood is not depressed. Affect is not labile, blunt, angry or inappropriate.        Speech: Speech normal. Speech is not rapid and pressured or slurred.        Behavior: Behavior is not agitated.        Thought Content: Thought  content normal. Thought content is not paranoid. Thought content does not include homicidal or suicidal  ideation.        Cognition and Memory: Cognition normal.        Judgment: Judgment normal.     Comments: Insight intact. No auditory or visual hallucinations. No delusions.  No outbursts of anger as in the past.    Lab Review:     Component Value Date/Time   NA 141 08/23/2013 0919   K 3.9 08/23/2013 0919   CL 111 (H) 08/23/2013 0919   CO2 27 08/23/2013 0919   GLUCOSE 91 08/23/2013 0919   BUN 17 08/23/2013 0919   CREATININE 0.77 08/23/2013 0919   CALCIUM 8.3 (L) 08/23/2013 0919   PROT 7.3 08/23/2013 0919   ALBUMIN 4.0 08/23/2013 0919   AST 25 08/23/2013 0919   ALT 31 08/23/2013 0919   ALKPHOS 76 08/23/2013 0919   BILITOT 0.3 08/23/2013 0919   GFRNONAA >60 08/23/2013 0919   GFRAA >60 08/23/2013 0919       Component Value Date/Time   WBC 5.5 08/23/2013 0919   RBC 4.44 08/23/2013 0919   HGB 13.2 08/23/2013 0919   HCT 40.4 08/23/2013 0919   PLT 301 08/23/2013 0919   MCV 91 08/23/2013 0919   MCH 29.8 08/23/2013 0919   MCHC 32.7 08/23/2013 0919   RDW 12.2 08/23/2013 0919   LYMPHSABS 2.1 08/23/2013 0919   MONOABS 0.4 08/23/2013 0919   EOSABS 0.1 08/23/2013 0919   BASOSABS 0.0 08/23/2013 0919    No results found for: POCLITH, LITHIUM   No results found for: PHENYTOIN, PHENOBARB, VALPROATE, CBMZ   .res Assessment: Plan:    Attention deficit hyperactivity disorder (ADHD), predominantly inattentive type - Plan: lisdexamfetamine (VYVANSE) 50 MG capsule, lisdexamfetamine (VYVANSE) 50 MG capsule, lisdexamfetamine (VYVANSE) 50 MG capsule  Panic disorder with agoraphobia - Plan: ALPRAZolam (XANAX) 0.25 MG tablet, sertraline (ZOLOFT) 50 MG tablet  Insomnia due to mental condition - Plan: clonazePAM (KLONOPIN) 0.5 MG tablet  Bipolar II disorder (HCC) - Plan: Carbamazepine (EQUETRO) 300 MG CP12, lamoTRIgine (LAMICTAL) 100 MG tablet  Generalized anxiety disorder - Plan:  sertraline (ZOLOFT) 50 MG tablet  Social anxiety disorder - Plan: sertraline (ZOLOFT) 50 MG tablet  PMDD (premenstrual dysphoric disorder) - Plan: sertraline (ZOLOFT) 50 MG tablet   Greater than 50% 25 mins of video time with patient was spent on counseling and coordination of care. We discussed the following. Supportive therapy dealing with care of mother in law and other new stressors.  Pt needs some along time and encouraged to pursue it.  Disc problem solving techniques and involve H.  Encourage exercise as stress management.  .  Self care encouraged.   We discussed multipled diagnoses and meds, necessary polypharmacy.  Tolerates meds to good effect.  Has made good decisions for self care.  Zoloft helped anxiety added.  She has had  more panic attacks but she feels her situational.  She will call if they continue or if they worsen.  Call if anger problems recur. Anxiety disorders worse with stress. Try to use LED of Xanax.  May have PTSD DT childhoold  Overall has more anxiety since last here but situational.  Anger managed, she has a history of anger outbursts that have caused significant social difficulty.  Bipolar symptoms are under control.  Bipolar disorder managed with the Equetro.  ADD managed with Vyvanse 50  PMDD managed with as needed Xanax.  No need for med changes except consider increasing sertraline for panic but that can cause mood swings so we are trying to minimize the dose.  Consider the alternative of fluvoxamine which has a good anti-anxiety effect but lower risk of triggering mania..   Pleased with the balance.   Cont counseling every other week. Marland Kitchen Keep working on self care.  Disc caretaking dementia patient and how to handle dealing with her decline.  Has worked on boundaries with h and mother in life  FU 6 months  Deane Staggers MD, DFAPA  Please see After Visit Summary for patient specific instructions.  No future appointments.   No orders of the  defined types were placed in this encounter.     -------------------------------

## 2020-08-22 ENCOUNTER — Other Ambulatory Visit: Payer: Self-pay | Admitting: Psychiatry

## 2020-08-22 DIAGNOSIS — F5105 Insomnia due to other mental disorder: Secondary | ICD-10-CM

## 2020-08-25 ENCOUNTER — Other Ambulatory Visit: Payer: Self-pay | Admitting: Psychiatry

## 2020-08-25 DIAGNOSIS — F900 Attention-deficit hyperactivity disorder, predominantly inattentive type: Secondary | ICD-10-CM

## 2020-11-24 ENCOUNTER — Other Ambulatory Visit: Payer: Self-pay | Admitting: Psychiatry

## 2020-11-24 DIAGNOSIS — F900 Attention-deficit hyperactivity disorder, predominantly inattentive type: Secondary | ICD-10-CM

## 2020-11-24 NOTE — Telephone Encounter (Signed)
F/u due 12/23 last filled 9/17

## 2020-12-04 NOTE — Telephone Encounter (Signed)
Mirena rcvd/charged 12/11/19

## 2020-12-15 ENCOUNTER — Ambulatory Visit: Payer: Medicaid Other | Admitting: Obstetrics and Gynecology

## 2020-12-21 ENCOUNTER — Ambulatory Visit: Payer: Medicaid Other | Admitting: Obstetrics and Gynecology

## 2021-01-20 ENCOUNTER — Other Ambulatory Visit: Payer: Self-pay | Admitting: Family Medicine

## 2021-01-25 ENCOUNTER — Other Ambulatory Visit: Payer: Self-pay | Admitting: Psychiatry

## 2021-01-25 ENCOUNTER — Ambulatory Visit: Payer: BC Managed Care – PPO | Admitting: Obstetrics and Gynecology

## 2021-01-25 DIAGNOSIS — F411 Generalized anxiety disorder: Secondary | ICD-10-CM

## 2021-01-25 DIAGNOSIS — F4001 Agoraphobia with panic disorder: Secondary | ICD-10-CM

## 2021-01-25 DIAGNOSIS — F4010 Social phobia, unspecified: Secondary | ICD-10-CM

## 2021-01-25 DIAGNOSIS — F3281 Premenstrual dysphoric disorder: Secondary | ICD-10-CM

## 2021-01-25 DIAGNOSIS — F5105 Insomnia due to other mental disorder: Secondary | ICD-10-CM

## 2021-02-18 ENCOUNTER — Encounter: Payer: Self-pay | Admitting: Obstetrics and Gynecology

## 2021-02-18 ENCOUNTER — Ambulatory Visit (INDEPENDENT_AMBULATORY_CARE_PROVIDER_SITE_OTHER): Payer: BC Managed Care – PPO | Admitting: Obstetrics and Gynecology

## 2021-02-18 ENCOUNTER — Other Ambulatory Visit: Payer: Self-pay

## 2021-02-18 VITALS — BP 131/95 | HR 82 | Ht 62.0 in | Wt 169.0 lb

## 2021-02-18 DIAGNOSIS — Z6830 Body mass index (BMI) 30.0-30.9, adult: Secondary | ICD-10-CM

## 2021-02-18 DIAGNOSIS — Z1239 Encounter for other screening for malignant neoplasm of breast: Secondary | ICD-10-CM

## 2021-02-18 DIAGNOSIS — Z01419 Encounter for gynecological examination (general) (routine) without abnormal findings: Secondary | ICD-10-CM | POA: Diagnosis not present

## 2021-02-18 DIAGNOSIS — Z131 Encounter for screening for diabetes mellitus: Secondary | ICD-10-CM

## 2021-02-18 DIAGNOSIS — E669 Obesity, unspecified: Secondary | ICD-10-CM

## 2021-02-18 DIAGNOSIS — Z13220 Encounter for screening for lipoid disorders: Secondary | ICD-10-CM

## 2021-02-18 NOTE — Progress Notes (Signed)
Gynecology Annual Exam   PCP: Excell SeltzerBedsole, Amy E, MD  Chief Complaint:  Chief Complaint  Patient presents with   Gynecologic Exam    Annual - discuss weight loss. RM 3    History of Present Illness: Patient is a 36 y.o. [id number] presents for annual exam. The patient has no complaints today.   LMP: Patient's last menstrual period was 01/25/2021. Mostly amenorrhea on Mirena IUD  The patient is sexually active. She currently uses IUD for contraception. She denies dyspareunia.  The patient does perform self breast exams.  There is no notable family history of breast or ovarian cancer in her family.  The patient wears seatbelts: yes.   The patient has regular exercise: not asked.    Review of Systems: Review of Systems  Constitutional:  Negative for chills and fever.  HENT:  Negative for congestion.   Respiratory:  Negative for cough and shortness of breath.   Cardiovascular:  Negative for chest pain and palpitations.  Gastrointestinal:  Negative for abdominal pain, constipation, diarrhea, heartburn, nausea and vomiting.  Genitourinary:  Negative for dysuria, frequency and urgency.  Skin:  Negative for itching and rash.  Neurological:  Negative for dizziness and headaches.  Endo/Heme/Allergies:  Negative for polydipsia.  Psychiatric/Behavioral:  Negative for depression.    Past Medical History:  Patient Active Problem List   Diagnosis Date Noted   Social anxiety disorder 09/09/2019   GAD (generalized anxiety disorder) 10/31/2017   THYROMEGALY 12/25/2006    Qualifier: Diagnosis of  By: Ermalene SearingBedsole MD, Amy      POLYCYSTIC OVARIAN DISEASE 12/25/2006    Qualifier: Diagnosis of  By: Ermalene SearingBedsole MD, Amy      INSULIN RESISTANCE SYNDROME 12/25/2006    Qualifier: Diagnosis of  By: Ermalene SearingBedsole MD, Amy      BIPOLAR AFFECTIVE DISORDER 12/25/2006    Qualifier: Diagnosis of  By: Ermalene SearingBedsole MD, Amy      ANXIETY 12/25/2006    Qualifier: Diagnosis of  By: Ermalene SearingBedsole MD, Amy      MIGRAINE,  COMMON 12/25/2006    Qualifier: Diagnosis of  By: Ermalene SearingBedsole MD, Amy      ALLERGIC RHINITIS 12/25/2006    Qualifier: Diagnosis of  By: Ermalene SearingBedsole MD, Amy      CHEST PAIN, ACUTE 12/25/2006    Qualifier: Diagnosis of  By: Ermalene SearingBedsole MD, Amy       Past Surgical History:  Past Surgical History:  Procedure Laterality Date   COLONOSCOPY     INTRAUTERINE DEVICE INSERTION  12/02/2011   IUD REMOVAL  02/29/2016   WISDOM TOOTH EXTRACTION      Gynecologic History:  Patient's last menstrual period was 01/25/2021. Contraception: 12/11/2019 Mirena IUD Last Pap: Results were:12/11/2019 ASCUS with NEGATIVE high risk HPV   Obstetric History: [id number]  Family History:  Family History  Problem Relation Age of Onset   Colon cancer Paternal Grandmother [id number]   Hypertension Paternal Grandmother    Anxiety disorder Mother    Depression Mother    Hypertension Father    Depression Father    Bipolar disorder Maternal Grandmother    Depression Maternal Grandmother    Melanoma Maternal Grandfather    Prostate cancer Maternal Grandfather    Melanoma Paternal Grandfather     Social History:  Social History   Socioeconomic History   Marital status: Married    Spouse name: Not on file   Number of children: Not on file   Years of education: Not on file   Highest education level: Not  on file  Occupational History   Not on file  Tobacco Use   Smoking status: Never   Smokeless tobacco: Never  Vaping Use   Vaping Use: Never used  Substance and Sexual Activity   Alcohol use: Yes   Drug use: Never   Sexual activity: Yes    Birth control/protection: I.U.D.  Other Topics Concern   Not on file  Social History Narrative   Not on file   Social Determinants of Health   Financial Resource Strain: Not on file  Food Insecurity: Not on file  Transportation Needs: Not on file  Physical Activity: Not on file  Stress: Not on file  Social Connections: Not on file  Intimate Partner Violence: Not on  file    Allergies:  No Known Allergies  Medications: Prior to Admission medications   Medication Sig Start Date End Date Taking? Authorizing Provider  ALPRAZolam (XANAX) 0.25 MG tablet Take 1 tablet (0.25 mg total) by mouth daily as needed. 07/30/20   Cottle, Steva Ready., MD  Carbamazepine (EQUETRO) 300 MG CP12 Take 3 capsules (900 mg total) by mouth at bedtime. 07/30/20   Cottle, Steva Ready., MD  clonazePAM (KLONOPIN) 0.5 MG tablet TAKE 1 TABLET  BY MOUTH EVERY MORNING AND 2 TABLETS  BY MOUTH EVERY NIGHT AT BEDTIME 01/26/21   Cottle, Steva Ready., MD  fluticasone [facility]) 50 MCG/ACT nasal spray Place into both nostrils daily.    [provider]  lamoTRIgine (LAMICTAL) 100 MG tablet Take 1 tablet (100 mg total) by mouth 2 (two) times daily. 07/30/20   Cottle, Steva Ready., MD  lisdexamfetamine (VYVANSE) 50 MG capsule Take 1 capsule (50 mg total) by mouth daily. 09/24/20   Cottle, Steva Ready., MD  Mefenamic Acid (PONSTEL) 250 MG CAPS 2 capsules (500 mg) po once and the onset of menses, then 1 capsule (250 mg) every 6 hours prn cramping max 3 days Patient not taking: Reported on 07/30/2020 12/26/18   Vena Austria, MD  montelukast (SINGULAIR) 10 MG tablet  10/23/18   [provider]  Multiple Vitamin (THERA) TABS Take by mouth.    [provider]  sertraline (ZOLOFT) 50 MG tablet TAKE 1 TABLET BY MOUTH ONCE DAILY 01/26/21   Cottle, Steva Ready., MD  SUMAtriptan (IMITREX) 50 MG tablet TAKE 1 TABLET BY MOUTH AS NEEDED FOR MIGRAINE, MAY REPEAT DOSE AFTER 2 HOURS AS NEEDED 08/02/16   [provider]  topiramate (TOPAMAX) 50 MG tablet TAKE 1 TABLET(50 MG) BY MOUTH EVERY DAY 05/31/16   [provider]  traZODone (DESYREL) 150 MG tablet TAKE 1 TABLET BY MOUTH EVERY NIGHT AT BEDTIME 08/22/20   Cottle, Steva Ready., MD  VYVANSE 50 MG capsule TAKE 1 CAPSULE BY MOUTH ONCE DAILY 08/25/20   Cottle, Steva Ready., MD  VYVANSE 50 MG capsule TAKE 1 CAPSULE BY MOUTH ONCE DAILY  11/24/20   Cottle, Steva Ready., MD  VYVANSE 50 MG capsule TAKE 1 CAPSULE BY MOUTH ONCE DAILY 01/26/21   Cottle, Steva Ready., MD    Physical Exam Vitals: Blood pressure (!) 131/95, pulse 82, height 5\' 2"  (1.575 m), weight 169 lb (76.7 kg), last menstrual period 01/25/2021. Body mass index is 30.91 kg/m.   General: NAD HEENT: normocephalic, anicteric Thyroid: no enlargement, no palpable nodules Pulmonary: No increased work of breathing, CTAB Cardiovascular: RRR, distal pulses 2+ Breast: Breast symmetrical, no tenderness, no palpable nodules or masses, no skin or nipple retraction present, no nipple discharge.  No axillary or supraclavicular lymphadenopathy. Abdomen: NABS, soft, non-tender, non-distended.  Umbilicus without lesions.  No hepatomegaly, splenomegaly or masses palpable. No evidence of hernia  Genitourinary:  External: Normal external female genitalia.  Normal urethral meatus, normal Bartholin's and Skene's glands.    Vagina: Normal vaginal mucosa, no evidence of prolapse.    Cervix: Grossly normal in appearance, no bleeding, IUD strings visualized  Uterus: Non-enlarged, mobile, normal contour.  No CMT  Adnexa: ovaries non-enlarged, no adnexal masses  Rectal: deferred  Lymphatic: no evidence of inguinal lymphadenopathy Extremities: no edema, erythema, or tenderness Neurologic: Grossly intact Psychiatric: mood appropriate, affect full  Female chaperone present for pelvic and breast  portions of the physical exam    Assessment: 36 y.o. [id number] routine annual exam  Plan: Problem List Items Addressed This Visit   None   1) STI screening  was not offered and accepted  2)  ASCCP guidelines and rational discussed.  Patient opts for every 3 years screening interval  3) Contraception - the patient is currently using  IUD.  She is happy with her current form of contraception and plans to continue  4) Routine healthcare maintenance including cholesterol, diabetes screening  discussed Ordered today - HgbA1C elevated may be candidate for ozempic - Discussed Wyovi and mounjaro (currently back order for several doses)  5) Return in about 1 year (around 02/18/2022) for annual.    Vena Austria, MD, [facility], [facility] 02/18/2021, 2:58 PM

## 2021-02-19 ENCOUNTER — Encounter: Payer: Self-pay | Admitting: Obstetrics and Gynecology

## 2021-02-19 LAB — LIPID PANEL
Chol/HDL Ratio: 3.1 ratio (ref 0.0–4.4)
Cholesterol, Total: 248 mg/dL — ABNORMAL HIGH (ref 100–199)
HDL: 81 mg/dL (ref >39)
LDL Chol Calc (NIH): 146 mg/dL — ABNORMAL HIGH (ref 0–99)
Triglycerides: 123 mg/dL (ref 0–149)
VLDL Cholesterol Cal: 21 mg/dL (ref 5–40)

## 2021-02-19 LAB — HEMOGLOBIN A1C
Est. average glucose Bld gHb Est-mCnc: 97 mg/dL
Hgb A1c MFr Bld: 5 % (ref 4.8–5.6)

## 2021-02-27 ENCOUNTER — Other Ambulatory Visit: Payer: Self-pay | Admitting: Psychiatry

## 2021-02-27 DIAGNOSIS — F5105 Insomnia due to other mental disorder: Secondary | ICD-10-CM

## 2021-02-27 DIAGNOSIS — F3181 Bipolar II disorder: Secondary | ICD-10-CM

## 2021-02-27 DIAGNOSIS — F4001 Agoraphobia with panic disorder: Secondary | ICD-10-CM

## 2021-03-01 ENCOUNTER — Encounter: Payer: Self-pay | Admitting: Psychiatry

## 2021-03-01 ENCOUNTER — Ambulatory Visit (INDEPENDENT_AMBULATORY_CARE_PROVIDER_SITE_OTHER): Payer: BC Managed Care – PPO | Admitting: Psychiatry

## 2021-03-01 DIAGNOSIS — F3181 Bipolar II disorder: Secondary | ICD-10-CM

## 2021-03-01 DIAGNOSIS — F900 Attention-deficit hyperactivity disorder, predominantly inattentive type: Secondary | ICD-10-CM

## 2021-03-01 DIAGNOSIS — F4001 Agoraphobia with panic disorder: Secondary | ICD-10-CM

## 2021-03-01 DIAGNOSIS — F4010 Social phobia, unspecified: Secondary | ICD-10-CM | POA: Diagnosis not present

## 2021-03-01 DIAGNOSIS — F5105 Insomnia due to other mental disorder: Secondary | ICD-10-CM

## 2021-03-01 DIAGNOSIS — F411 Generalized anxiety disorder: Secondary | ICD-10-CM

## 2021-03-01 DIAGNOSIS — F3281 Premenstrual dysphoric disorder: Secondary | ICD-10-CM

## 2021-03-01 MED ORDER — EQUETRO 300 MG PO CP12: 3.0000 | ORAL_CAPSULE | Freq: Every day | ORAL | 5 refills | Status: DC

## 2021-03-01 MED ORDER — TOPIRAMATE 25 MG PO TABS: ORAL_TABLET | ORAL | 1 refills | Status: DC

## 2021-03-01 MED ORDER — LISDEXAMFETAMINE DIMESYLATE 50 MG PO CAPS: 50.0000 mg | ORAL_CAPSULE | Freq: Every day | ORAL | 0 refills | Status: DC

## 2021-03-01 MED ORDER — ALPRAZOLAM 0.25 MG PO TABS: 0.2500 mg | ORAL_TABLET | Freq: Every day | ORAL | 5 refills | Status: DC | PRN

## 2021-03-01 MED ORDER — CLONAZEPAM 0.5 MG PO TABS: ORAL_TABLET | ORAL | 1 refills | Status: DC

## 2021-03-01 MED ORDER — LAMOTRIGINE 100 MG PO TABS: 100.0000 mg | ORAL_TABLET | Freq: Two times a day (BID) | ORAL | 1 refills | Status: DC

## 2021-03-01 NOTE — Progress Notes (Signed)
Marissa Holmes [id number] [birth year] 36 y.o.   Virtual Visit via Telephone Note  I connected with pt by telephone and verified that I am speaking with the correct person using two identifiers.   I discussed the limitations, risks, security and privacy concerns of performing an evaluation and management service by telephone and the availability of in person appointments. I also discussed with the patient that there may be a patient responsible charge related to this service. The patient expressed understanding and agreed to proceed.  I discussed the assessment and treatment plan with the patient. The patient was provided an opportunity to ask questions and all were answered. The patient agreed with the plan and demonstrated an understanding of the instructions.   The patient was advised to call back or seek an in-person evaluation if the symptoms worsen or if the condition fails to improve as anticipated.  I provided 30 minutes of non-face-to-face time during this encounter. The call started at 1130 and ended at 1205. The patient was located at home and the provider was located office.   Subjective:   Patient ID:  Marissa Holmes is a 36 y.o. (DOB 02-19-1985) female.  Chief Complaint:  Chief Complaint  Patient presents with   Follow-up   ADHD   Bipolar II disorder    Anxiety    Anxiety Symptoms include nervous/anxious behavior. Patient reports no chest pain, confusion, decreased concentration, palpitations or suicidal ideas.    Marissa Holmes presents to the office today for follow-up of seveal dxes.  visit March 2020.  She was doing well..  No med changes were made.  She has been maintained on Vyvanse for ADHD, Equetro for bipolar disorder, clonazepam and alprazolam for anxiety, lamotrigine for bipolar disorder, sertraline for anxiety and Topamax for migraine headache and trazodone for sleep.  seen September 2020.  No meds were changed.  The following was  noted: A lot of stress.  M moved in law moved in.  Huge adjustment.  She is bipolar and likely dementia.  Building a new house.  Home schooling.  H new job and hard for [employer] and working from home.  No opportunity for alone time.  Everyone around all the time and she needs alone time.  Adam's ex W moving to [facility] and wanted to take the boys.  Boys decided to do that and left in July.  The most difficult thing for her.  Last couple of weeks very hard.  Several panic attacks after they left.  Girls having difficulty in school.  All the kids struggling and unhappy and I can't fix it.  H Adam.  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  Letting go of things initially caused guilt but now feels better. Still satisfied with meds.  Anxiety has been situational. Overall not bad.  Started gym back at gym boxing has been good.  Faith helps. No outbursts. .  Not working and H travels several days/week except for Covid. Better self care helps.  Anx around birthdays and holidays DT broken rel with parents.  Occ causes panic.  May 29, 2019 appointment the following is noted: A lot of family change with boys moving in and M in law still there.  That's hard relationship.  Stressed.  Not great but situational and seeing counselor regularly which helps.  A lot of anxiety situational. More panic and needed Xanax more.  It helps.  Panic is triggered. Limits caffeine AM and exercising Plan no med changes  12/19/19 appt with following noted: Consistent with meds without SE. Mood pretty even keel but life is not. HA worse in last 3 mos and menstrual cycle irregular also. M in law lives with them now is bipolar and paranoia and cognitive problems.  Stress of dealing with this. Still in individual and family therapy. In general less depressed and more happy and more productive.  No concerns for med changes. Plan no med changes  07/30/2020 appointment with the following noted: Anxiety is pretty terrible.   Situational probably.  M dementia is getting worse and is negative to pt only.  Not much peace in the house.   Her M in law's counselor is Administrator and cut back therapy bc she is getting dementia. I'm not great but is in counseling and exercising and doing things to help herself.   H has been helpful. Using Xanax 0.25 mg prn more than ever. No problems with meds or desire for med change.  Compliant. Adding Zoloft helped a lot. Kids are happy.   Plan: No med changes  03/01/2021 appointment with the following noted: Migraine today and about every couple weeks but better with neuro tx. Recently more anxious.  Easily overstimulated.  Racing thoughts including anxious, worry, some to do thoughts.  Panic weekly and usually triggered but not always. Gets overstimulated in PM 2 cups coffee AM Since before Xmas. No trigger.   H agrees. Consistent with meds. Gained 10# last 24 mos with limited calorie intact.  Exercising more.  Most of the weight gain has happened since stopping topiramate in September. No SE meds.  Patient reports stable mood and denies depressed or irritable moods except PMS. No outbursts.  Anxiety is worse. Patient denies difficulty with sleep initiation or maintenance as long as no late caffeine. Denies appetite disturbance.  Patient reports that energy and motivation have been good.  Patient denies any difficulty with concentration.  Patient denies any suicidal ideation.  Previous psych medication trials include citalopram,  paroxetine 60 with no response,  sertraline 200,  Seroquel which caused sedation, InVega, Latuda with mouth ulcers. Equetro 900, lamotrigine 200 Topiramate 100 SE Xanax, Clonazepam 0.5 HS, trazodone 150 Vyvanse 50  Review of Systems:  Review of Systems  Constitutional:  Positive for unexpected weight change.  Cardiovascular:  Negative for chest pain and palpitations.  Neurological:  Positive for headaches. Negative for tremors and weakness.   Psychiatric/Behavioral:  Negative for agitation, behavioral problems, confusion, decreased concentration, dysphoric mood, hallucinations, self-injury, sleep disturbance and suicidal ideas. The patient is nervous/anxious. The patient is not hyperactive.    Medications: I have reviewed the patient's current medications.  Current Outpatient Medications  Medication Sig Dispense Refill   fluticasone (FLONASE) 50 MCG/ACT nasal spray Place into both nostrils daily.     MAGNESIUM PO Take by mouth daily.     montelukast (SINGULAIR) 10 MG tablet      Multiple Vitamin (THERA) TABS Take by mouth.     nortriptyline (PAMELOR) 10 MG capsule Take 10 mg by mouth 2 (two) times daily.     sertraline (ZOLOFT) 50 MG tablet TAKE 1 TABLET BY MOUTH ONCE DAILY 90 tablet 1   topiramate (TOPAMAX) 25 MG tablet 1 tablet in the morning for 1 week, then 1 tablet in the morning and before the evening meal for 1 week, then 2 tablet in the morning and 1 in the evening 90 tablet 1   traZODone (DESYREL) 150 MG tablet TAKE 1 TABLET BY MOUTH EVERY NIGHT AT BEDTIME  90 tablet 0   Ubrogepant (UBRELVY) 50 MG TABS Take 1 tablet by mouth daily as needed.     ALPRAZolam (XANAX) 0.25 MG tablet Take 1 tablet (0.25 mg total) by mouth daily as needed. 30 tablet 5   Carbamazepine (EQUETRO) 300 MG CP12 Take 3 capsules (900 mg total) by mouth at bedtime. 90 capsule 5   clonazePAM (KLONOPIN) 0.5 MG tablet TAKE 1 TABLET  BY MOUTH EVERY MORNING AND 2 TABLETS  BY MOUTH EVERY NIGHT AT BEDTIME 90 tablet 1   lamoTRIgine (LAMICTAL) 100 MG tablet Take 1 tablet (100 mg total) by mouth 2 (two) times daily. 180 tablet 1   [START ON 03/29/2021] lisdexamfetamine (VYVANSE) 50 MG capsule Take 1 capsule (50 mg total) by mouth daily. 30 capsule 0   lisdexamfetamine (VYVANSE) 50 MG capsule Take 1 capsule (50 mg total) by mouth daily. 30 capsule 0   No current facility-administered medications for this visit.    Medication Side Effects: Other: dry      Allergies: No Known Allergies  Past Medical History:  Diagnosis Date   Abnormal Pap smear of cervix    Anxiety    Attention deficit disorder (ADD)    Bipolar 1 disorder (HCC)    Mild depression    Type 2 diabetes mellitus (HCC)     Family History  Problem Relation Age of Onset   Anxiety disorder Mother    Depression Mother    Hypertension Father    Depression Father    Bipolar disorder Maternal Grandmother    Depression Maternal Grandmother    Melanoma Maternal Grandfather    Prostate cancer Maternal Grandfather    Colon cancer Paternal Grandmother 52   Hypertension Paternal Grandmother    Melanoma Paternal Grandfather     Social History   Socioeconomic History   Marital status: Married    Spouse name: Not on file   Number of children: Not on file   Years of education: Not on file   Highest education level: Not on file  Occupational History   Not on file  Tobacco Use   Smoking status: Never   Smokeless tobacco: Never  Vaping Use   Vaping Use: Never used  Substance and Sexual Activity   Alcohol use: Yes   Drug use: Never   Sexual activity: Yes    Birth control/protection: I.U.D.  Other Topics Concern   Not on file  Social History Narrative   Not on file   Social Determinants of Health   Financial Resource Strain: Not on file  Food Insecurity: Not on file  Transportation Needs: Not on file  Physical Activity: Not on file  Stress: Not on file  Social Connections: Not on file  Intimate Partner Violence: Not on file    Past Medical History, Surgical history, Social history, and Family history were reviewed and updated as appropriate.  GM died Aug.  Went to funeral alone caused panic.  Saw M first time in 20 years, she's toxic.  Please see review of systems for further details on the patient's review from today.   Objective:   Physical Exam:  There were no vitals taken for this visit.  Physical Exam Constitutional:      General: She is not in  acute distress.    Appearance: She is well-developed.  Neurological:     Mental Status: She is alert and oriented to person, place, and time.     Cranial Nerves: No dysarthria.  Psychiatric:  Attention and Perception: She is attentive. She does not perceive auditory hallucinations.        Mood and Affect: Mood is anxious. Mood is not depressed. Affect is not labile, blunt, angry or inappropriate.        Speech: Speech normal. Speech is not rapid and pressured or slurred.        Behavior: Behavior is not agitated.        Thought Content: Thought content normal. Thought content is not delusional. Thought content does not include homicidal or suicidal ideation.        Cognition and Memory: Cognition normal.        Judgment: Judgment normal.     Comments: Insight intact. No auditory or visual hallucinations. No delusions.  No outbursts of anger as in the past. More anxious    Lab Review:     Component Value Date/Time   NA 141 08/23/2013 0919   K 3.9 08/23/2013 0919   CL 111 (H) 08/23/2013 0919   CO2 27 08/23/2013 0919   GLUCOSE 91 08/23/2013 0919   BUN 17 08/23/2013 0919   CREATININE 0.77 08/23/2013 0919   CALCIUM 8.3 (L) 08/23/2013 0919   PROT 7.3 08/23/2013 0919   ALBUMIN 4.0 08/23/2013 0919   AST 25 08/23/2013 0919   ALT 31 08/23/2013 0919   ALKPHOS 76 08/23/2013 0919   BILITOT 0.3 08/23/2013 0919   GFRNONAA >60 08/23/2013 0919   GFRAA >60 08/23/2013 0919       Component Value Date/Time   WBC 5.5 08/23/2013 0919   RBC 4.44 08/23/2013 0919   HGB 13.2 08/23/2013 0919   HCT 40.4 08/23/2013 0919   PLT 301 08/23/2013 0919   MCV 91 08/23/2013 0919   MCH 29.8 08/23/2013 0919   MCHC 32.7 08/23/2013 0919   RDW 12.2 08/23/2013 0919   LYMPHSABS 2.1 08/23/2013 0919   MONOABS 0.4 08/23/2013 0919   EOSABS 0.1 08/23/2013 0919   BASOSABS 0.0 08/23/2013 0919    No results found for: POCLITH, LITHIUM   No results found for: PHENYTOIN, PHENOBARB, VALPROATE, CBMZ    .res Assessment: Plan:    Bipolar II disorder ([place]) - Plan: topiramate (TOPAMAX) 25 MG tablet, lamoTRIgine (LAMICTAL) 100 MG tablet, Carbamazepine (EQUETRO) 300 MG CP12  Generalized anxiety disorder - Plan: topiramate (TOPAMAX) 25 MG tablet  Social anxiety disorder - Plan: topiramate (TOPAMAX) 25 MG tablet  Panic disorder with agoraphobia - Plan: topiramate (TOPAMAX) 25 MG tablet, ALPRAZolam (XANAX) 0.25 MG tablet  Attention deficit hyperactivity disorder (ADHD), predominantly inattentive type - Plan: lisdexamfetamine (VYVANSE) 50 MG capsule, lisdexamfetamine (VYVANSE) 50 MG capsule  Insomnia due to mental condition - Plan: clonazePAM (KLONOPIN) 0.5 MG tablet  PMDD (premenstrual dysphoric disorder)   Greater than 50% 25 mins of phone time with patient was spent on counseling and coordination of care. We discussed the following. Supportive therapy dealing with care of mother in law and other new stressors.  Pt needs some along time and encouraged to pursue it.  Disc problem solving techniques and involve H.  Encourage exercise as stress management.  .  Self care encouraged.   We discussed multipled diagnoses and meds, necessary polypharmacy.    Discussed her worsening anxiety and racing thoughts and her weight gain.  Discussed the options for treating including restarting the topiramate, increasing sertraline, increasing Equetro.  Increasing sertraline could contribute to mood cycling.  Zoloft helped anxiety but the anxiety is worse lately without obvious reasons.  She can have triggered or spontaneous panic attacks  weekly.   Try to use LED of Xanax and clonazepam Discussed it is not ideal to be using benzodiazepines and stimulants together but her symptoms have been unmanageable without the combination.  However we would prefer not to go up in the clonazepam dose for anxiety unless absolutely essential.  Stopped topiramate in Sept and disc wt gain could result.  The weight gain and  worsening anxiety do seem to correlate with the time when she stopped topiramate.  Discussed that topiramate is an active ingredient in a marketed weight loss med.  She could not tolerate 100 mg due to tiredness and fogginess. Restart topiramate is her preference. Start topiramate 25 mg every morning for 1 week, then 25 mg twice daily, then if tolerated 50 mg every morning and 25 mg before the evening meal.  May have PTSD DT childhoold  Overall has more anxiety since last here but situational.  Anger managed, she has a history of anger outbursts that have caused significant social difficulty.  Bipolar symptoms are under control.  Bipolar disorder managed with the Equetro.  ADD managed with Vyvanse 50.  Discussed the risk that Vyvanse could cause anxiety or panic.  She feels it is necessary to continue this medicine for her ADD  PMDD managed with as needed Xanax.  Consider the alternative of fluvoxamine which has a good anti-anxiety effect but lower risk of triggering mania..     Cont counseling every other week. Marland Kitchen Keep working on self care.  Disc caretaking dementia patient and how to handle dealing with her decline.  Has worked on boundaries with h and mother in life  Polypharmacy undesirable but has been necessary. Continue Equetro 900 mg nightly Continue clonazepam 0.5 mg every morning and 1 mg nightly Continue lamotrigine 100 mg twice daily Continue sertraline 50 mg daily Continue trazodone 150 mg nightly Continue Vyvanse 50 mg every morning  FU 6-8 weeks  Lynder Parents MD, DFAPA  Please see After Visit Summary for patient specific instructions.  No future appointments.   No orders of the defined types were placed in this encounter.      -------------------------------

## 2021-03-02 ENCOUNTER — Other Ambulatory Visit: Payer: Self-pay | Admitting: Psychiatry

## 2021-03-02 DIAGNOSIS — F5105 Insomnia due to other mental disorder: Secondary | ICD-10-CM

## 2021-03-09 ENCOUNTER — Telehealth: Payer: Self-pay | Admitting: Psychiatry

## 2021-03-09 ENCOUNTER — Other Ambulatory Visit: Payer: Self-pay

## 2021-03-09 ENCOUNTER — Telehealth: Payer: Self-pay

## 2021-03-09 DIAGNOSIS — F5105 Insomnia due to other mental disorder: Secondary | ICD-10-CM

## 2021-03-09 MED ORDER — TRAZODONE HCL 150 MG PO TABS: 150.0000 mg | ORAL_TABLET | Freq: Every day | ORAL | 0 refills | Status: DC

## 2021-03-09 NOTE — Telephone Encounter (Signed)
Pt called and LM at 3:33 today. She said for the last week she has been trying to get her Trazodone RF sent in and also that her Vyvanse needs a P. Auth. I do see that Dr. Jennelle Human cancelled a Trazodone RF so I'm not sure if she should get it? Also, there is nothing from pharmacy about a P.A. for Vyvanse. Can we call pt back?

## 2021-03-09 NOTE — Telephone Encounter (Signed)
PA for lisdexamfetamine (VYVANSE) 50 MG capsule #30 has been approved by [employer] [place] through covermymeds. Effective Dates: 03/09/21-03/08/22

## 2021-03-09 NOTE — Telephone Encounter (Signed)
PA approved and trazodone sent.LVM

## 2021-04-21 ENCOUNTER — Ambulatory Visit (INDEPENDENT_AMBULATORY_CARE_PROVIDER_SITE_OTHER): Payer: BC Managed Care – PPO | Admitting: Psychiatry

## 2021-04-21 ENCOUNTER — Encounter: Payer: Self-pay | Admitting: Psychiatry

## 2021-04-21 DIAGNOSIS — F3181 Bipolar II disorder: Secondary | ICD-10-CM | POA: Diagnosis not present

## 2021-04-21 DIAGNOSIS — F4010 Social phobia, unspecified: Secondary | ICD-10-CM | POA: Diagnosis not present

## 2021-04-21 DIAGNOSIS — F5105 Insomnia due to other mental disorder: Secondary | ICD-10-CM

## 2021-04-21 DIAGNOSIS — F411 Generalized anxiety disorder: Secondary | ICD-10-CM | POA: Diagnosis not present

## 2021-04-21 DIAGNOSIS — F900 Attention-deficit hyperactivity disorder, predominantly inattentive type: Secondary | ICD-10-CM

## 2021-04-21 DIAGNOSIS — F4001 Agoraphobia with panic disorder: Secondary | ICD-10-CM | POA: Diagnosis not present

## 2021-04-21 DIAGNOSIS — F3281 Premenstrual dysphoric disorder: Secondary | ICD-10-CM

## 2021-04-21 MED ORDER — TOPIRAMATE 50 MG PO TABS: 50.0000 mg | ORAL_TABLET | Freq: Every day | ORAL | 1 refills | Status: DC

## 2021-04-21 MED ORDER — LISDEXAMFETAMINE DIMESYLATE 50 MG PO CAPS: 50.0000 mg | ORAL_CAPSULE | Freq: Every day | ORAL | 0 refills | Status: DC

## 2021-04-21 MED ORDER — TRAZODONE HCL 150 MG PO TABS: 150.0000 mg | ORAL_TABLET | Freq: Every day | ORAL | 1 refills | Status: DC

## 2021-04-21 MED ORDER — SERTRALINE HCL 50 MG PO TABS: 50.0000 mg | ORAL_TABLET | Freq: Every day | ORAL | 1 refills | Status: DC

## 2021-04-21 MED ORDER — LAMOTRIGINE 100 MG PO TABS: 100.0000 mg | ORAL_TABLET | Freq: Two times a day (BID) | ORAL | 1 refills | Status: DC

## 2021-04-21 MED ORDER — EQUETRO 300 MG PO CP12: 3.0000 | ORAL_CAPSULE | Freq: Every day | ORAL | 5 refills | Status: DC

## 2021-04-21 MED ORDER — CLONAZEPAM 0.5 MG PO TABS: ORAL_TABLET | ORAL | 5 refills | Status: DC

## 2021-04-21 NOTE — Progress Notes (Signed)
Marissa Holmes ?[id number] ?Jan 15, 1986 ?36 y.o.  ? ?Virtual Visit via Telephone Note ? ?I connected with pt by telephone and verified that I am speaking with the correct person using two identifiers. ?  ?I discussed the limitations, risks, security and privacy concerns of performing an evaluation and management service by telephone and the availability of in person appointments. I also discussed with the patient that there may be a patient responsible charge related to this service. The patient expressed understanding and agreed to proceed. ? ?I discussed the assessment and treatment plan with the patient. The patient was provided an opportunity to ask questions and all were answered. The patient agreed with the plan and demonstrated an understanding of the instructions. ?  ?The patient was advised to call back or seek an in-person evaluation if the symptoms worsen or if the condition fails to improve as anticipated. ? ?I provided 30 minutes of non-face-to-face time during this encounter. The patient was located at home and the provider was located office. ?Session from 330 until 4 PM ? ?Subjective:  ? ?Patient ID:  Marissa Holmes is a 36 y.o. (DOB 08/14/1985) female. ? ?Chief Complaint:  ?Chief Complaint  ?Patient presents with  ? Follow-up  ?  Bipolar II disorder ([place])  ? ADHD  ? Anxiety  ? ? ?Anxiety ?Symptoms include nervous/anxious behavior. Patient reports no confusion, decreased concentration, palpitations or suicidal ideas.  ? ? ?Marissa Holmes presents to the office today for follow-up of seveal dxes. ? ?visit March 2020.  She was doing well..  No med changes were made.  She has been maintained on Vyvanse for ADHD, Equetro for bipolar disorder, clonazepam and alprazolam for anxiety, lamotrigine for bipolar disorder, sertraline for anxiety and Topamax for migraine headache and trazodone for sleep. ? ?seen September 2020.  No meds were changed.  The following was noted: ?A lot of stress.   M moved in law moved in.  Huge adjustment.  She is bipolar and likely dementia.  Building a new house.  Home schooling.  H new job and hard for [employer] and working from home.  No opportunity for alone time.  Everyone around all the time and she needs alone time.  Adam's ex W moving to [facility] and wanted to take the boys.  Boys decided to do that and left in July.  The most difficult thing for her.  Last couple of weeks very hard.  Several panic attacks after they left.  Girls having difficulty in school.  All the kids struggling and unhappy and I can't fix it.  H Adam.  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  Letting go of things initially caused guilt but now feels better. ?Still satisfied with meds.  Anxiety has been situational. ?Overall not bad.  Started gym back at gym boxing has been good.  Faith helps. No outbursts. .  Not working and H travels several days/week except for Covid. Better self care helps.  Anx around birthdays and holidays DT broken rel with parents.  Occ causes panic. ? ?May 29, 2019 appointment the following is noted: ?A lot of family change with boys moving in and M in law still there.  That's hard relationship.  Stressed.  Not great but situational and seeing counselor regularly which helps.  A lot of anxiety situational. ?More panic and needed Xanax more.  It helps.  Panic is triggered. ?Limits caffeine AM and exercising ?Plan no med changes ? ?12/19/19 appt with following  noted: ?Consistent with meds without SE. ?Mood pretty even keel but life is not. ?HA worse in last 3 mos and menstrual cycle irregular also. ?M in law lives with them now is bipolar and paranoia and cognitive problems.  Stress of dealing with this. ?Still in individual and family therapy. ?In general less depressed and more happy and more productive.  ?No concerns for med changes. ?Plan no med changes ? ?07/30/2020 appointment with the following noted: ?Anxiety is pretty terrible.  Situational probably.  M  dementia is getting worse and is negative to pt only.  Not much peace in the house.   ?Her M in law's counselor is Administrator and cut back therapy bc she is getting dementia. ?I'm not great but is in counseling and exercising and doing things to help herself.   ?H has been helpful. ?Using Xanax 0.25 mg prn more than ever. ?No problems with meds or desire for med change.  Compliant. ?Adding Zoloft helped a lot. ?Kids are happy.   ?Plan: No med changes ? ?03/01/2021 appointment with the following noted: ?Migraine today and about every couple weeks but better with neuro tx. ?Recently more anxious.  Easily overstimulated.  Racing thoughts including anxious, worry, some to do thoughts.  Panic weekly and usually triggered but not always. ?Gets overstimulated in PM ?2 cups coffee AM ?Since before Xmas. ?No trigger.   H agrees. ?Consistent with meds. ?Gained 10# last 18 mos with limited calorie intact.  Exercising more.  Most of the weight gain has happened since stopping topiramate in September. ?No SE meds. ?Plan: Stopped topiramate in Sept and disc wt gain could result.  The weight gain and worsening anxiety do seem to correlate with the time when she stopped topiramate.  Discussed that topiramate is an active ingredient in a marketed weight loss med.  She could not tolerate 100 mg due to tiredness and fogginess. ?Restart topiramate is her preference. ?Start topiramate 25 mg every morning for 1 week, then 25 mg twice daily, then if tolerated 50 mg every morning and 25 mg before the evening meal. ?Continue Equetro 900 mg nightly ?Continue clonazepam 0.5 mg every morning and 1 mg nightly ?Continue lamotrigine 100 mg twice daily ?Continue sertraline 50 mg daily ?Continue trazodone 150 mg nightly ?Continue Vyvanse 50 mg every morning ? ?04/21/2021 appointment with the following noted: ?Brain fog at 75 mg topiramate.  Anxiety better with topiramate 50 mg AM.  Tolerating this. ?Stresful week.  Yesterday overreacting and took  herself out of the situation. 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 ?BP is better.  Less stress bc not around M in [place] all the time like before. ?Tolerating meds.  No med changes desired. ? ?Patient reports stable mood and denies depressed or irritable moods except PMS. No outbursts.  Anxiety is worse. Patient denies difficulty with sleep initiation or maintenance as long as no late caffeine. Denies appetite disturbance.  Patient reports that energy and motivation have been good.  Patient denies any difficulty with concentration.  Patient denies any suicidal ideation. ? ?Previous psych medication trials include citalopram,  ?paroxetine 60 with no response,  ?sertraline 200,  ?Seroquel which caused sedation, InVega, Latuda with mouth ulcers. ?Equetro 900, lamotrigine 200 ?Topiramate 100 SE ?Xanax, Clonazepam 1 mg HS, trazodone 150 ?Vyvanse 50 ? ?Review of Systems:  ?Review of Systems  ?Constitutional:  Positive for unexpected weight change.  ?Cardiovascular:  Negative for palpitations.  ?Neurological:  Positive for headaches. Negative for tremors  and weakness.  ?Psychiatric/Behavioral:  Negative for agitation, behavioral problems, confusion, decreased concentration, dysphoric mood, hallucinations, self-injury, sleep disturbance and suicidal ideas. The patient is nervous/anxious. The patient is not hyperactive.   ? ?Medications: I have reviewed the patient's current medications. ? ?Current Outpatient Medications  ?Medication Sig Dispense Refill  ? ALPRAZolam (XANAX) 0.25 MG tablet Take 1 tablet (0.25 mg total) by mouth daily as needed. 30 tablet 5  ? fluticasone (FLONASE) 50 MCG/ACT nasal spray Place into both nostrils daily.    ? [START ON 06/16/2021] lisdexamfetamine (VYVANSE) 50 MG capsule Take 1 capsule (50 mg total) by mouth daily. 30 capsule 0  ? MAGNESIUM PO Take by mouth daily.    ? montelukast (SINGULAIR) 10 MG tablet     ? Multiple Vitamin (THERA) TABS Take by mouth.    ?  nortriptyline (PAMELOR) 10 MG capsule Take 30 mg by mouth at bedtime.    ? Ubrogepant (UBRELVY) 50 MG TABS Take 1 tablet by mouth daily as needed.    ? Carbamazepine (EQUETRO) 300 MG CP12 Take 3 capsules (900

## 2021-07-12 ENCOUNTER — Emergency Department
Admission: EM | Admit: 2021-07-12 | Discharge: 2021-07-12 | Disposition: A | Discharge status: Home / Self Care | Payer: BC Managed Care – PPO | Attending: Student in an Organized Health Care Education/Training Program | Admitting: Student in an Organized Health Care Education/Training Program

## 2021-07-12 ENCOUNTER — Emergency Department: Payer: BC Managed Care – PPO

## 2021-07-12 ENCOUNTER — Other Ambulatory Visit: Payer: Self-pay

## 2021-07-12 DIAGNOSIS — S0990XA Unspecified injury of head, initial encounter: Secondary | ICD-10-CM | POA: Diagnosis present

## 2021-07-12 DIAGNOSIS — W228XXA Striking against or struck by other objects, initial encounter: Secondary | ICD-10-CM | POA: Insufficient documentation

## 2021-07-12 DIAGNOSIS — R519 Headache, unspecified: Secondary | ICD-10-CM

## 2021-07-12 LAB — BASIC METABOLIC PANEL
Anion gap: 6 (ref 5–15)
BUN: 17 mg/dL (ref 6–20)
CO2: 25 mmol/L (ref 22–32)
Calcium: 9 mg/dL (ref 8.9–10.3)
Chloride: 107 mmol/L (ref 98–111)
Creatinine, Ser: 0.86 mg/dL (ref 0.44–1.00)
GFR, Estimated: >60 mL/min (ref >60)
Glucose, Bld: 95 mg/dL (ref 70–99)
Potassium: 3.6 mmol/L (ref 3.5–5.1)
Sodium: 138 mmol/L (ref 135–145)

## 2021-07-12 LAB — URINALYSIS, ROUTINE W REFLEX MICROSCOPIC
Bilirubin Urine: NEGATIVE
Glucose, UA: NEGATIVE mg/dL
Hgb urine dipstick: NEGATIVE
Ketones, ur: NEGATIVE mg/dL
Leukocytes,Ua: NEGATIVE
Nitrite: NEGATIVE
Protein, ur: NEGATIVE mg/dL
Specific Gravity, Urine: 1.014 (ref 1.005–1.030)
pH: 7 (ref 5.0–8.0)

## 2021-07-12 LAB — CBC
HCT: 40.9 % (ref 36.0–46.0)
Hemoglobin: 13.6 g/dL (ref 12.0–15.0)
MCH: 31 pg (ref 26.0–34.0)
MCHC: 33.3 g/dL (ref 30.0–36.0)
MCV: 93.2 fL (ref 80.0–100.0)
Platelets: 315 10*3/uL (ref 150–400)
RBC: 4.39 MIL/uL (ref 3.87–5.11)
RDW: 11.9 % (ref 11.5–15.5)
WBC: 6.4 10*3/uL (ref 4.0–10.5)
nRBC: 0 % (ref 0.0–0.2)

## 2021-07-12 MED ORDER — ACETAMINOPHEN 500 MG PO TABS
1000.0000 mg | ORAL_TABLET | Freq: Once | ORAL | Status: AC
  Administered 2021-07-12: 1000 mg via ORAL
  Filled 2021-07-12: qty 2

## 2021-07-12 MED ORDER — PROCHLORPERAZINE MALEATE 10 MG PO TABS
10.0000 mg | ORAL_TABLET | Freq: Four times a day (QID) | ORAL | 0 refills | Status: AC | PRN
Start: 2021-07-12 — End: ?

## 2021-07-12 MED ORDER — PROCHLORPERAZINE EDISYLATE 10 MG/2ML IJ SOLN
10.0000 mg | Freq: Once | INTRAMUSCULAR | Status: AC
Start: 2021-07-12 — End: 2021-07-12
  Administered 2021-07-12: 10 mg via INTRAVENOUS
  Filled 2021-07-12: qty 2

## 2021-07-12 MED ORDER — DIPHENHYDRAMINE HCL 50 MG/ML IJ SOLN
12.5000 mg | Freq: Once | INTRAMUSCULAR | Status: AC
  Administered 2021-07-12: 12.5 mg via INTRAVENOUS
  Filled 2021-07-12: qty 1
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
## 2021-08-05 ENCOUNTER — Other Ambulatory Visit: Payer: Self-pay | Admitting: Psychiatry

## 2021-08-05 DIAGNOSIS — F900 Attention-deficit hyperactivity disorder, predominantly inattentive type: Secondary | ICD-10-CM

## 2021-09-07 ENCOUNTER — Other Ambulatory Visit: Payer: Self-pay | Admitting: Psychiatry

## 2021-09-07 DIAGNOSIS — F4001 Agoraphobia with panic disorder: Secondary | ICD-10-CM

## 2021-09-07 DIAGNOSIS — F411 Generalized anxiety disorder: Secondary | ICD-10-CM

## 2021-09-07 DIAGNOSIS — F900 Attention-deficit hyperactivity disorder, predominantly inattentive type: Secondary | ICD-10-CM

## 2021-09-07 DIAGNOSIS — F4010 Social phobia, unspecified: Secondary | ICD-10-CM

## 2021-09-07 DIAGNOSIS — F3181 Bipolar II disorder: Secondary | ICD-10-CM

## 2021-09-07 NOTE — Telephone Encounter (Signed)
Call to RS

## 2021-09-08 NOTE — Telephone Encounter (Signed)
Pt is scheduled 10/24 

## 2021-09-08 NOTE — Telephone Encounter (Signed)
Filled 6/29 appt 10/24

## 2021-09-16 ENCOUNTER — Ambulatory Visit (INDEPENDENT_AMBULATORY_CARE_PROVIDER_SITE_OTHER): Payer: BC Managed Care – PPO | Admitting: Psychiatry

## 2021-09-16 ENCOUNTER — Encounter: Payer: Self-pay | Admitting: Psychiatry

## 2021-09-16 DIAGNOSIS — F3281 Premenstrual dysphoric disorder: Secondary | ICD-10-CM

## 2021-09-16 DIAGNOSIS — F900 Attention-deficit hyperactivity disorder, predominantly inattentive type: Secondary | ICD-10-CM

## 2021-09-16 DIAGNOSIS — F5105 Insomnia due to other mental disorder: Secondary | ICD-10-CM

## 2021-09-16 DIAGNOSIS — F3181 Bipolar II disorder: Secondary | ICD-10-CM | POA: Diagnosis not present

## 2021-09-16 DIAGNOSIS — F411 Generalized anxiety disorder: Secondary | ICD-10-CM | POA: Diagnosis not present

## 2021-09-16 DIAGNOSIS — F4010 Social phobia, unspecified: Secondary | ICD-10-CM | POA: Diagnosis not present

## 2021-09-16 DIAGNOSIS — F4001 Agoraphobia with panic disorder: Secondary | ICD-10-CM | POA: Diagnosis not present

## 2021-09-16 MED ORDER — SERTRALINE HCL 50 MG PO TABS: 75.0000 mg | ORAL_TABLET | Freq: Every day | ORAL | 0 refills | Status: DC
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
## 2021-10-18 ENCOUNTER — Other Ambulatory Visit: Payer: Self-pay | Admitting: Psychiatry

## 2021-10-18 DIAGNOSIS — F900 Attention-deficit hyperactivity disorder, predominantly inattentive type: Secondary | ICD-10-CM

## 2021-11-08 ENCOUNTER — Other Ambulatory Visit: Payer: Self-pay | Admitting: Psychiatry

## 2021-11-08 DIAGNOSIS — F5105 Insomnia due to other mental disorder: Secondary | ICD-10-CM

## 2021-11-22 ENCOUNTER — Other Ambulatory Visit: Payer: Self-pay | Admitting: Psychiatry

## 2021-11-22 DIAGNOSIS — F4001 Agoraphobia with panic disorder: Secondary | ICD-10-CM

## 2021-11-23 ENCOUNTER — Other Ambulatory Visit: Payer: Self-pay | Admitting: Psychiatry

## 2021-11-23 DIAGNOSIS — F900 Attention-deficit hyperactivity disorder, predominantly inattentive type: Secondary | ICD-10-CM

## 2021-11-24 ENCOUNTER — Other Ambulatory Visit: Payer: Self-pay

## 2021-11-30 ENCOUNTER — Ambulatory Visit: Payer: BC Managed Care – PPO | Admitting: Psychiatry

## 2021-12-08 ENCOUNTER — Other Ambulatory Visit: Payer: Self-pay | Admitting: Psychiatry

## 2021-12-08 DIAGNOSIS — F5105 Insomnia due to other mental disorder: Secondary | ICD-10-CM

## 2021-12-28 ENCOUNTER — Other Ambulatory Visit: Payer: Self-pay | Admitting: Psychiatry

## 2021-12-28 DIAGNOSIS — F4001 Agoraphobia with panic disorder: Secondary | ICD-10-CM

## 2021-12-29 ENCOUNTER — Ambulatory Visit: Payer: BC Managed Care – PPO | Admitting: Psychiatry

## 2022-01-13 ENCOUNTER — Other Ambulatory Visit: Payer: Self-pay | Admitting: Psychiatry

## 2022-01-13 DIAGNOSIS — F5105 Insomnia due to other mental disorder: Secondary | ICD-10-CM

## 2022-01-13 NOTE — Telephone Encounter (Signed)
Filled 11/2 appt 1/30

## 2022-02-08 ENCOUNTER — Other Ambulatory Visit: Payer: Self-pay | Admitting: Psychiatry

## 2022-02-08 DIAGNOSIS — F900 Attention-deficit hyperactivity disorder, predominantly inattentive type: Secondary | ICD-10-CM

## 2022-02-14 ENCOUNTER — Other Ambulatory Visit: Payer: Self-pay | Admitting: Psychiatry

## 2022-02-14 DIAGNOSIS — F5105 Insomnia due to other mental disorder: Secondary | ICD-10-CM

## 2022-02-14 DIAGNOSIS — F900 Attention-deficit hyperactivity disorder, predominantly inattentive type: Secondary | ICD-10-CM

## 2022-02-14 DIAGNOSIS — F4010 Social phobia, unspecified: Secondary | ICD-10-CM

## 2022-02-14 DIAGNOSIS — F3181 Bipolar II disorder: Secondary | ICD-10-CM

## 2022-02-14 DIAGNOSIS — F411 Generalized anxiety disorder: Secondary | ICD-10-CM

## 2022-02-14 DIAGNOSIS — F4001 Agoraphobia with panic disorder: Secondary | ICD-10-CM

## 2022-03-08 ENCOUNTER — Encounter: Payer: Self-pay | Admitting: Psychiatry

## 2022-03-08 ENCOUNTER — Telehealth (INDEPENDENT_AMBULATORY_CARE_PROVIDER_SITE_OTHER): Payer: BC Managed Care – PPO | Admitting: Psychiatry

## 2022-03-08 DIAGNOSIS — F4001 Agoraphobia with panic disorder: Secondary | ICD-10-CM

## 2022-03-08 DIAGNOSIS — F900 Attention-deficit hyperactivity disorder, predominantly inattentive type: Secondary | ICD-10-CM

## 2022-03-08 DIAGNOSIS — F411 Generalized anxiety disorder: Secondary | ICD-10-CM

## 2022-03-08 DIAGNOSIS — F5105 Insomnia due to other mental disorder: Secondary | ICD-10-CM

## 2022-03-08 DIAGNOSIS — F4010 Social phobia, unspecified: Secondary | ICD-10-CM

## 2022-03-08 DIAGNOSIS — F3281 Premenstrual dysphoric disorder: Secondary | ICD-10-CM

## 2022-03-08 DIAGNOSIS — F3181 Bipolar II disorder: Secondary | ICD-10-CM

## 2022-03-08 MED ORDER — LAMOTRIGINE 100 MG PO TABS: 100.0000 mg | ORAL_TABLET | Freq: Two times a day (BID) | ORAL | 1 refills | Status: DC

## 2022-03-08 MED ORDER — LISDEXAMFETAMINE DIMESYLATE 60 MG PO CAPS: 60.0000 mg | ORAL_CAPSULE | Freq: Every day | ORAL | 0 refills | Status: DC

## 2022-03-08 MED ORDER — ALPRAZOLAM 0.25 MG PO TABS: 0.2500 mg | ORAL_TABLET | Freq: Every day | ORAL | 2 refills | Status: DC | PRN

## 2022-03-08 MED ORDER — TOPIRAMATE 50 MG PO TABS: 50.0000 mg | ORAL_TABLET | Freq: Every day | ORAL | 1 refills | Status: DC

## 2022-03-08 MED ORDER — SERTRALINE HCL 50 MG PO TABS: 75.0000 mg | ORAL_TABLET | Freq: Every day | ORAL | 1 refills | Status: DC

## 2022-03-08 MED ORDER — TRAZODONE HCL 150 MG PO TABS: 150.0000 mg | ORAL_TABLET | Freq: Every day | ORAL | 1 refills | Status: DC

## 2022-03-08 MED ORDER — EQUETRO 300 MG PO CP12: 900.0000 mg | ORAL_CAPSULE | Freq: Every evening | ORAL | 5 refills | Status: DC

## 2022-03-08 MED ORDER — CLONAZEPAM 0.5 MG PO TABS: ORAL_TABLET | ORAL | 5 refills | Status: DC
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
## 2022-04-14 ENCOUNTER — Other Ambulatory Visit: Payer: Self-pay | Admitting: Psychiatry

## 2022-04-14 DIAGNOSIS — F3181 Bipolar II disorder: Secondary | ICD-10-CM

## 2022-04-14 DIAGNOSIS — F900 Attention-deficit hyperactivity disorder, predominantly inattentive type: Secondary | ICD-10-CM

## 2022-05-10 ENCOUNTER — Ambulatory Visit (INDEPENDENT_AMBULATORY_CARE_PROVIDER_SITE_OTHER): Payer: BC Managed Care – PPO | Admitting: Psychiatry

## 2022-05-10 ENCOUNTER — Encounter: Payer: Self-pay | Admitting: Psychiatry

## 2022-05-10 VITALS — BP 152/77 | HR 77

## 2022-05-10 DIAGNOSIS — F4010 Social phobia, unspecified: Secondary | ICD-10-CM

## 2022-05-10 DIAGNOSIS — F900 Attention-deficit hyperactivity disorder, predominantly inattentive type: Secondary | ICD-10-CM | POA: Diagnosis not present

## 2022-05-10 DIAGNOSIS — F411 Generalized anxiety disorder: Secondary | ICD-10-CM

## 2022-05-10 DIAGNOSIS — F3181 Bipolar II disorder: Secondary | ICD-10-CM

## 2022-05-10 DIAGNOSIS — F4001 Agoraphobia with panic disorder: Secondary | ICD-10-CM

## 2022-05-10 DIAGNOSIS — F3281 Premenstrual dysphoric disorder: Secondary | ICD-10-CM

## 2022-05-10 DIAGNOSIS — F5105 Insomnia due to other mental disorder: Secondary | ICD-10-CM

## 2022-05-10 MED ORDER — VYVANSE 60 MG PO CAPS: 60.0000 mg | ORAL_CAPSULE | Freq: Every day | ORAL | 0 refills | Status: DC

## 2022-05-10 MED ORDER — VYVANSE 60 MG PO CAPS: 60.0000 mg | ORAL_CAPSULE | ORAL | 0 refills | Status: DC
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
## 2022-06-16 ENCOUNTER — Other Ambulatory Visit: Payer: Self-pay | Admitting: Psychiatry

## 2022-06-16 DIAGNOSIS — F4001 Agoraphobia with panic disorder: Secondary | ICD-10-CM

## 2022-06-16 DIAGNOSIS — F4010 Social phobia, unspecified: Secondary | ICD-10-CM

## 2022-06-16 DIAGNOSIS — F3181 Bipolar II disorder: Secondary | ICD-10-CM

## 2022-06-16 DIAGNOSIS — F411 Generalized anxiety disorder: Secondary | ICD-10-CM

## 2022-08-12 ENCOUNTER — Other Ambulatory Visit: Payer: Self-pay | Admitting: Psychiatry

## 2022-08-12 DIAGNOSIS — F3281 Premenstrual dysphoric disorder: Secondary | ICD-10-CM

## 2022-08-12 DIAGNOSIS — F4001 Agoraphobia with panic disorder: Secondary | ICD-10-CM

## 2022-08-12 DIAGNOSIS — F411 Generalized anxiety disorder: Secondary | ICD-10-CM

## 2022-08-12 DIAGNOSIS — F4010 Social phobia, unspecified: Secondary | ICD-10-CM

## 2022-08-17 ENCOUNTER — Other Ambulatory Visit: Payer: Self-pay | Admitting: Psychiatry

## 2022-08-17 DIAGNOSIS — F900 Attention-deficit hyperactivity disorder, predominantly inattentive type: Secondary | ICD-10-CM

## 2022-09-02 ENCOUNTER — Other Ambulatory Visit: Payer: Self-pay | Admitting: Psychiatry

## 2022-09-02 DIAGNOSIS — F4001 Agoraphobia with panic disorder: Secondary | ICD-10-CM

## 2022-09-12 ENCOUNTER — Other Ambulatory Visit: Payer: Self-pay | Admitting: Psychiatry

## 2022-09-12 DIAGNOSIS — F5105 Insomnia due to other mental disorder: Secondary | ICD-10-CM

## 2022-09-14 ENCOUNTER — Other Ambulatory Visit: Payer: Self-pay | Admitting: Psychiatry

## 2022-09-14 DIAGNOSIS — F5105 Insomnia due to other mental disorder: Secondary | ICD-10-CM

## 2022-09-14 DIAGNOSIS — F900 Attention-deficit hyperactivity disorder, predominantly inattentive type: Secondary | ICD-10-CM

## 2022-10-03 ENCOUNTER — Telehealth: Payer: Self-pay | Admitting: Psychiatry

## 2022-10-03 NOTE — Telephone Encounter (Signed)
Received CMV Driver Medication Form. Patient needs form to be completed ASAP and a call when form has been sent. Ph: [phone]

## 2022-10-04 NOTE — Telephone Encounter (Signed)
Left pt a message to clarify what we are needing to complete on the form.

## 2022-10-12 ENCOUNTER — Other Ambulatory Visit: Payer: Self-pay | Admitting: Psychiatry

## 2022-10-12 DIAGNOSIS — F900 Attention-deficit hyperactivity disorder, predominantly inattentive type: Secondary | ICD-10-CM

## 2022-10-19 DIAGNOSIS — Z0289 Encounter for other administrative examinations: Secondary | ICD-10-CM

## 2022-11-10 ENCOUNTER — Other Ambulatory Visit: Payer: Self-pay | Admitting: Psychiatry

## 2022-11-10 DIAGNOSIS — F3281 Premenstrual dysphoric disorder: Secondary | ICD-10-CM

## 2022-11-10 DIAGNOSIS — F411 Generalized anxiety disorder: Secondary | ICD-10-CM

## 2022-11-10 DIAGNOSIS — F4001 Agoraphobia with panic disorder: Secondary | ICD-10-CM

## 2022-11-10 DIAGNOSIS — F4010 Social phobia, unspecified: Secondary | ICD-10-CM

## 2022-11-11 ENCOUNTER — Other Ambulatory Visit: Payer: Self-pay | Admitting: Psychiatry

## 2022-11-11 DIAGNOSIS — F3181 Bipolar II disorder: Secondary | ICD-10-CM

## 2022-11-11 NOTE — Telephone Encounter (Signed)
Rf appropriate; lv 04//2/24; lf 08/15/22 3ms 0 rf; nv 12/13/22. I think it's okay to rf for 3ms per rx hx.

## 2022-11-11 NOTE — Telephone Encounter (Signed)
Rf inappropriate LF 08/17/22 WITH 1RF. NV 12/13/22 LV04/02/24

## 2022-11-19 ENCOUNTER — Other Ambulatory Visit: Payer: Self-pay | Admitting: Psychiatry

## 2022-11-19 DIAGNOSIS — F900 Attention-deficit hyperactivity disorder, predominantly inattentive type: Secondary | ICD-10-CM

## 2022-11-21 NOTE — Telephone Encounter (Signed)
Rf appropriate lf 10/13/22; nv 12/13/22; lv 05/10/22

## 2022-11-25 ENCOUNTER — Other Ambulatory Visit: Payer: Self-pay | Admitting: Psychiatry

## 2022-11-25 DIAGNOSIS — F4001 Agoraphobia with panic disorder: Secondary | ICD-10-CM

## 2022-11-25 NOTE — Telephone Encounter (Signed)
LF 09/21; LV 04/2; NV 11/5

## 2022-12-13 ENCOUNTER — Ambulatory Visit: Payer: BC Managed Care – PPO | Admitting: Psychiatry

## 2022-12-23 ENCOUNTER — Other Ambulatory Visit: Payer: Self-pay | Admitting: Psychiatry

## 2022-12-23 DIAGNOSIS — F900 Attention-deficit hyperactivity disorder, predominantly inattentive type: Secondary | ICD-10-CM

## 2022-12-24 NOTE — Telephone Encounter (Signed)
Please call to schedule FU, canceled Oct appt.

## 2022-12-26 ENCOUNTER — Other Ambulatory Visit: Payer: Self-pay | Admitting: Psychiatry

## 2022-12-26 DIAGNOSIS — F4001 Agoraphobia with panic disorder: Secondary | ICD-10-CM

## 2022-12-26 NOTE — Telephone Encounter (Signed)
Lf 10/21; lv 05/10/22; nv 1/20

## 2023-02-06 ENCOUNTER — Other Ambulatory Visit: Payer: Self-pay | Admitting: Psychiatry

## 2023-02-06 DIAGNOSIS — F4010 Social phobia, unspecified: Secondary | ICD-10-CM

## 2023-02-06 DIAGNOSIS — F411 Generalized anxiety disorder: Secondary | ICD-10-CM

## 2023-02-06 DIAGNOSIS — F3181 Bipolar II disorder: Secondary | ICD-10-CM

## 2023-02-06 DIAGNOSIS — F4001 Agoraphobia with panic disorder: Secondary | ICD-10-CM

## 2023-02-06 DIAGNOSIS — F3281 Premenstrual dysphoric disorder: Secondary | ICD-10-CM

## 2023-02-10 NOTE — Telephone Encounter (Signed)
 Appt 02/2023

## 2023-02-11 ENCOUNTER — Other Ambulatory Visit: Payer: Self-pay | Admitting: Psychiatry

## 2023-02-11 DIAGNOSIS — F900 Attention-deficit hyperactivity disorder, predominantly inattentive type: Secondary | ICD-10-CM

## 2023-02-13 NOTE — Telephone Encounter (Signed)
 Lf 11/18; nv 1/20 lv 05/10/22

## 2023-02-17 ENCOUNTER — Other Ambulatory Visit: Payer: Self-pay | Admitting: Psychiatry

## 2023-02-17 DIAGNOSIS — F4001 Agoraphobia with panic disorder: Secondary | ICD-10-CM

## 2023-02-17 DIAGNOSIS — F411 Generalized anxiety disorder: Secondary | ICD-10-CM

## 2023-02-17 DIAGNOSIS — F4010 Social phobia, unspecified: Secondary | ICD-10-CM

## 2023-02-17 DIAGNOSIS — F3181 Bipolar II disorder: Secondary | ICD-10-CM

## 2023-02-27 ENCOUNTER — Encounter: Payer: Self-pay | Admitting: Psychiatry

## 2023-02-27 ENCOUNTER — Ambulatory Visit: Payer: BC Managed Care – PPO | Admitting: Psychiatry

## 2023-02-27 DIAGNOSIS — F411 Generalized anxiety disorder: Secondary | ICD-10-CM

## 2023-02-27 DIAGNOSIS — F5105 Insomnia due to other mental disorder: Secondary | ICD-10-CM

## 2023-02-27 DIAGNOSIS — F900 Attention-deficit hyperactivity disorder, predominantly inattentive type: Secondary | ICD-10-CM

## 2023-02-27 DIAGNOSIS — F3281 Premenstrual dysphoric disorder: Secondary | ICD-10-CM

## 2023-02-27 DIAGNOSIS — F3181 Bipolar II disorder: Secondary | ICD-10-CM | POA: Diagnosis not present

## 2023-02-27 DIAGNOSIS — F4010 Social phobia, unspecified: Secondary | ICD-10-CM | POA: Diagnosis not present

## 2023-02-27 DIAGNOSIS — G43009 Migraine without aura, not intractable, without status migrainosus: Secondary | ICD-10-CM

## 2023-02-27 DIAGNOSIS — F4001 Agoraphobia with panic disorder: Secondary | ICD-10-CM

## 2023-02-27 MED ORDER — TRAZODONE HCL 150 MG PO TABS: 150.0000 mg | ORAL_TABLET | Freq: Every day | ORAL | 1 refills | Status: DC

## 2023-02-27 MED ORDER — LAMOTRIGINE 100 MG PO TABS: 100.0000 mg | ORAL_TABLET | Freq: Two times a day (BID) | ORAL | 1 refills | Status: DC

## 2023-02-27 MED ORDER — TOPIRAMATE 50 MG PO TABS: 50.0000 mg | ORAL_TABLET | Freq: Every day | ORAL | 1 refills | Status: DC

## 2023-02-27 MED ORDER — VYVANSE 60 MG PO CAPS: 60.0000 mg | ORAL_CAPSULE | Freq: Every day | ORAL | 0 refills | Status: DC

## 2023-02-27 MED ORDER — VYVANSE 60 MG PO CAPS: 60.0000 mg | ORAL_CAPSULE | ORAL | 0 refills | Status: DC

## 2023-02-27 MED ORDER — EQUETRO 300 MG PO CP12: 3.0000 | ORAL_CAPSULE | Freq: Every day | ORAL | 5 refills | Status: DC

## 2023-02-27 MED ORDER — SERTRALINE HCL 50 MG PO TABS: ORAL_TABLET | ORAL | 1 refills | Status: DC

## 2023-02-27 MED ORDER — CLONAZEPAM 0.5 MG PO TABS: ORAL_TABLET | ORAL | 5 refills | Status: DC
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
## 2023-03-30 ENCOUNTER — Telehealth: Payer: Self-pay

## 2023-03-30 NOTE — Telephone Encounter (Signed)
Prior Authorization submitted and approved for Equetro 300 mg ER effective 03/30/23-03/29/24 with [employer]

## 2023-04-01 ENCOUNTER — Other Ambulatory Visit: Payer: Self-pay | Admitting: Psychiatry

## 2023-04-01 DIAGNOSIS — F4001 Agoraphobia with panic disorder: Secondary | ICD-10-CM

## 2023-06-21 ENCOUNTER — Other Ambulatory Visit: Payer: Self-pay | Admitting: Psychiatry

## 2023-06-21 DIAGNOSIS — F900 Attention-deficit hyperactivity disorder, predominantly inattentive type: Secondary | ICD-10-CM

## 2023-06-21 MED ORDER — VYVANSE 60 MG PO CAPS: 60.0000 mg | ORAL_CAPSULE | ORAL | 0 refills | Status: DC

## 2023-06-21 MED ORDER — VYVANSE 60 MG PO CAPS
60.0000 mg | ORAL_CAPSULE | ORAL | 0 refills | Status: DC
Start: 2023-07-19 — End: 2023-08-28

## 2023-07-24 ENCOUNTER — Other Ambulatory Visit: Payer: Self-pay | Admitting: Psychiatry

## 2023-07-24 DIAGNOSIS — F4001 Agoraphobia with panic disorder: Secondary | ICD-10-CM

## 2023-08-24 ENCOUNTER — Other Ambulatory Visit: Payer: Self-pay | Admitting: Psychiatry

## 2023-08-24 DIAGNOSIS — F4010 Social phobia, unspecified: Secondary | ICD-10-CM

## 2023-08-24 DIAGNOSIS — F4001 Agoraphobia with panic disorder: Secondary | ICD-10-CM

## 2023-08-24 DIAGNOSIS — F411 Generalized anxiety disorder: Secondary | ICD-10-CM

## 2023-08-24 DIAGNOSIS — F3281 Premenstrual dysphoric disorder: Secondary | ICD-10-CM

## 2023-08-28 ENCOUNTER — Encounter: Payer: Self-pay | Admitting: Psychiatry

## 2023-08-28 ENCOUNTER — Other Ambulatory Visit: Payer: Self-pay | Admitting: Psychiatry

## 2023-08-28 ENCOUNTER — Ambulatory Visit (INDEPENDENT_AMBULATORY_CARE_PROVIDER_SITE_OTHER): Payer: BC Managed Care – PPO | Admitting: Psychiatry

## 2023-08-28 DIAGNOSIS — F4010 Social phobia, unspecified: Secondary | ICD-10-CM | POA: Diagnosis not present

## 2023-08-28 DIAGNOSIS — F3181 Bipolar II disorder: Secondary | ICD-10-CM

## 2023-08-28 DIAGNOSIS — F5105 Insomnia due to other mental disorder: Secondary | ICD-10-CM

## 2023-08-28 DIAGNOSIS — F3281 Premenstrual dysphoric disorder: Secondary | ICD-10-CM

## 2023-08-28 DIAGNOSIS — F411 Generalized anxiety disorder: Secondary | ICD-10-CM | POA: Diagnosis not present

## 2023-08-28 DIAGNOSIS — F900 Attention-deficit hyperactivity disorder, predominantly inattentive type: Secondary | ICD-10-CM | POA: Diagnosis not present

## 2023-08-28 DIAGNOSIS — F4001 Agoraphobia with panic disorder: Secondary | ICD-10-CM

## 2023-08-28 DIAGNOSIS — G43009 Migraine without aura, not intractable, without status migrainosus: Secondary | ICD-10-CM

## 2023-08-28 MED ORDER — ALPRAZOLAM 0.25 MG PO TABS: 0.2500 mg | ORAL_TABLET | Freq: Every day | ORAL | 0 refills | Status: DC | PRN

## 2023-08-28 MED ORDER — VYVANSE 60 MG PO CAPS: 60.0000 mg | ORAL_CAPSULE | ORAL | 0 refills | Status: DC

## 2023-08-28 MED ORDER — SERTRALINE HCL 50 MG PO TABS: ORAL_TABLET | ORAL | 1 refills | Status: AC

## 2023-08-28 MED ORDER — CLONAZEPAM 0.5 MG PO TABS: ORAL_TABLET | ORAL | 5 refills | Status: DC

## 2023-08-28 MED ORDER — EQUETRO 300 MG PO CP12: 3.0000 | ORAL_CAPSULE | Freq: Every day | ORAL | 5 refills | Status: AC

## 2023-08-28 MED ORDER — TRAZODONE HCL 150 MG PO TABS: 150.0000 mg | ORAL_TABLET | Freq: Every day | ORAL | 1 refills | Status: DC

## 2023-08-28 MED ORDER — LAMOTRIGINE 100 MG PO TABS: 100.0000 mg | ORAL_TABLET | Freq: Two times a day (BID) | ORAL | 1 refills | Status: AC

## 2023-08-28 MED ORDER — TOPIRAMATE 50 MG PO TABS: 50.0000 mg | ORAL_TABLET | Freq: Every day | ORAL | 1 refills | Status: AC
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
## 2023-10-19 ENCOUNTER — Other Ambulatory Visit: Payer: Self-pay | Admitting: Psychiatry

## 2023-10-19 DIAGNOSIS — F4001 Agoraphobia with panic disorder: Secondary | ICD-10-CM

## 2023-12-26 ENCOUNTER — Other Ambulatory Visit: Payer: Self-pay | Admitting: Psychiatry

## 2023-12-26 DIAGNOSIS — F900 Attention-deficit hyperactivity disorder, predominantly inattentive type: Secondary | ICD-10-CM

## 2023-12-26 MED ORDER — VYVANSE 60 MG PO CAPS: 60.0000 mg | ORAL_CAPSULE | ORAL | 0 refills | Status: DC

## 2024-01-29 ENCOUNTER — Telehealth: Payer: Self-pay | Admitting: Behavioral Health

## 2024-01-29 NOTE — Telephone Encounter (Signed)
 Pt is on BRAND Vyvanse  due to being preferred on her previous Medicaid per Dr. Calhoun office note. There is not a medically necessity that is noted or a life threatening side effect from taking generic Vyvanse .  Unless a Med Watch is completed from a result of a life threatening side effect [employer] will likely not cover BRAND.  Will try to submit for a Brand PA. Also no other formularies previously tried and failed.

## 2024-01-29 NOTE — Telephone Encounter (Signed)
 Pt was on Medicaid and on brand Vyvanse . I asked her if she still wanted Brand since she was no longer on Medicaid. She said Dr. Geoffry writes it as being medically necessary.

## 2024-01-29 NOTE — Telephone Encounter (Signed)
 Pt indicated in call back that she doesn't want to change anything without Dr. Calhoun input or review. Brand will need a PA and pt is unsure about trying generic. I indicated that if Dr. Geoffry said she needed brand then we could initiate the PA.

## 2024-01-29 NOTE — Telephone Encounter (Signed)
 Pt called reporting only has [facility] now and need PA for Vyvanse  60 mg.

## 2024-02-04 ENCOUNTER — Telehealth: Payer: Self-pay

## 2024-02-04 NOTE — Telephone Encounter (Signed)
 PA approved for BRAND VYVANSE : this approval is for three months due to a verified shortage of a preferred medication. Effective Date: 01/29/2024 Authorization Expiration Date: 04/28/2024  [facility]

## 2024-02-12 ENCOUNTER — Telehealth: Payer: Self-pay | Admitting: Psychiatry

## 2024-02-12 NOTE — Telephone Encounter (Signed)
 Pt called stating she need someone to call pharm or ask Dr. Geoffry about other options. Per her new ins the vyvanse  went from Tier 1 to Tier 5 and she needs some alternatives so it will be cheaper.

## 2024-02-13 NOTE — Telephone Encounter (Signed)
 Patient reporting a tier change from Tier 1 to Tier 5 on brand Vyvanse . She had Medicaid and they required brand. She no longer has Medicaid but doesn't want to change to generic without your approval. She said she has never taken generic. As far back as I can see on PMPD she has had brand, but in looking at history in Epic it has been sent for generic.

## 2024-02-13 NOTE — Telephone Encounter (Signed)
 Pt.notified

## 2024-02-13 NOTE — Telephone Encounter (Signed)
 Yes, she will have to try the generic

## 2024-02-13 NOTE — Telephone Encounter (Signed)
 Pt called back wanting to clarify that the brand  name of vyvanse  has gone from a tier 1 to a tier 5. This makes it to expensive even with a PA. The generic is covered and she would like to know is that the choice dr. Geoffry would prefer and would she stay on the same dose. She doesn't want to change medicines.

## 2024-02-14 ENCOUNTER — Other Ambulatory Visit: Payer: Self-pay | Admitting: Psychiatry

## 2024-02-14 DIAGNOSIS — F900 Attention-deficit hyperactivity disorder, predominantly inattentive type: Secondary | ICD-10-CM

## 2024-02-14 MED ORDER — LISDEXAMFETAMINE DIMESYLATE 60 MG PO CAPS: 60.0000 mg | ORAL_CAPSULE | ORAL | 0 refills | Status: AC

## 2024-02-14 NOTE — Telephone Encounter (Signed)
 If pt has charles schwab she can use a co-pay savings card.

## 2024-02-14 NOTE — Telephone Encounter (Signed)
 Patient called stating she spoke  with pharmacy today. They informed her she would need a new Rx sent the [facility]. Pls advise.

## 2024-02-15 NOTE — Telephone Encounter (Signed)
 Dr Geoffry sent in Rx for generic Vyvanse 

## 2024-02-21 ENCOUNTER — Other Ambulatory Visit: Payer: Self-pay | Admitting: Psychiatry

## 2024-02-21 DIAGNOSIS — F4001 Agoraphobia with panic disorder: Secondary | ICD-10-CM

## 2024-02-22 ENCOUNTER — Other Ambulatory Visit: Payer: Self-pay | Admitting: Psychiatry

## 2024-02-22 DIAGNOSIS — F5105 Insomnia due to other mental disorder: Secondary | ICD-10-CM

## 2024-02-26 ENCOUNTER — Ambulatory Visit: Admitting: Psychiatry

## 2024-03-12 ENCOUNTER — Other Ambulatory Visit: Payer: Self-pay | Admitting: Psychiatry

## 2024-03-12 DIAGNOSIS — F5105 Insomnia due to other mental disorder: Secondary | ICD-10-CM

## 2024-03-26 ENCOUNTER — Ambulatory Visit: Admitting: Psychiatry

## 2024-05-11 IMAGING — CT CT HEAD W/O CM
4 series · 16 of 47 positions shown, 18 images · non-contrast
Comparison: None Available.

CLINICAL DATA: Head injury.



[Series 2: head wo · axial · 0.41mm/px · z∈[-97,+23]mm · 7 of 32 slices shown, 9 images]
[im 4/32  brain]
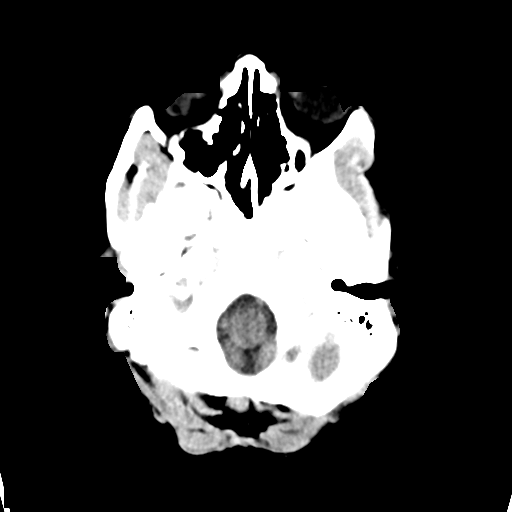
[im 4/32  bone]
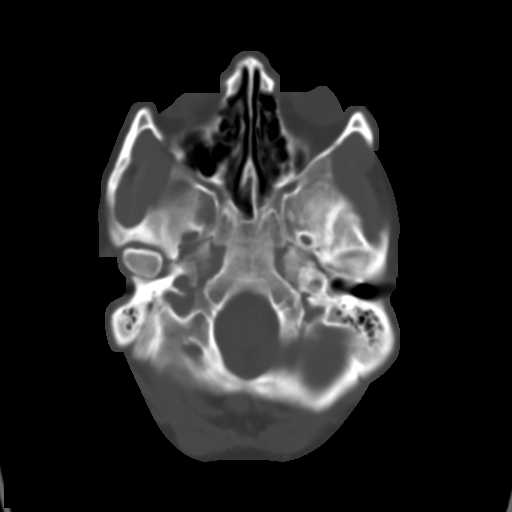
[im 8/32  brain]
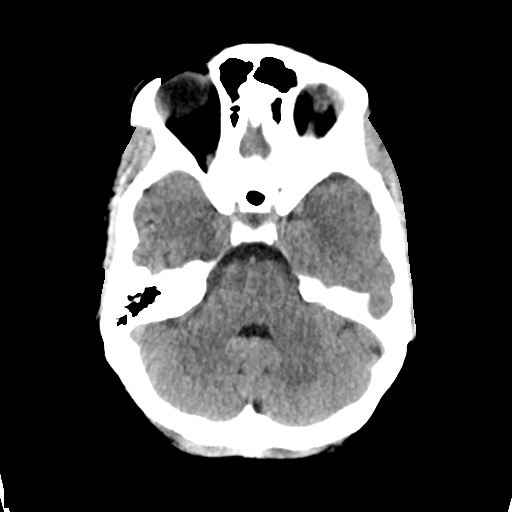
[im 12/32  brain]
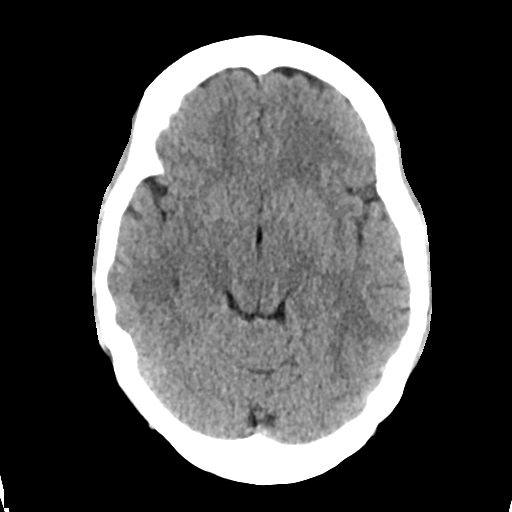
[im 16/32  brain]
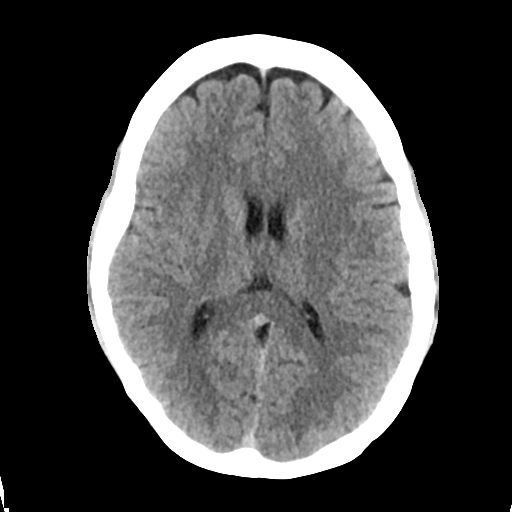
[im 20/32  brain]
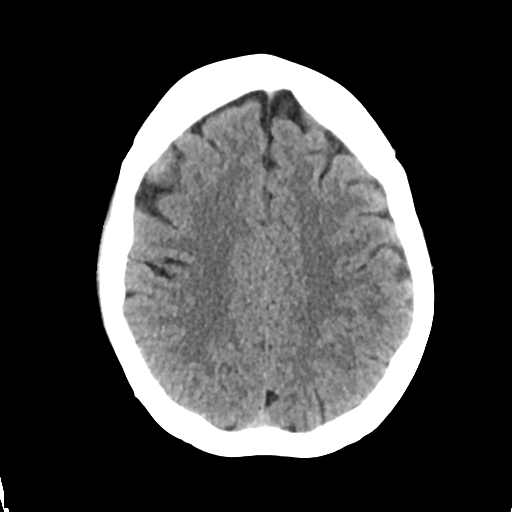
[im 20/32  bone]
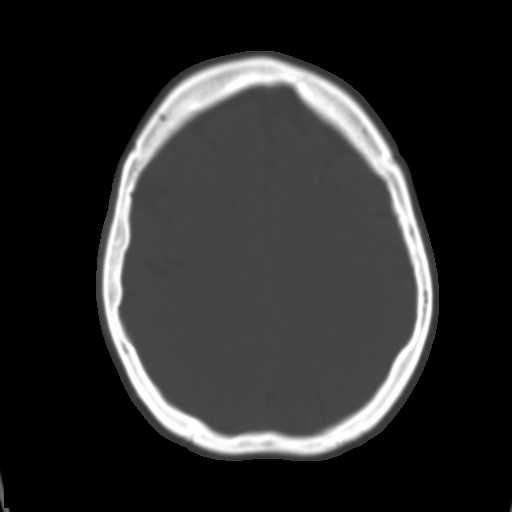
[im 24/32  brain]
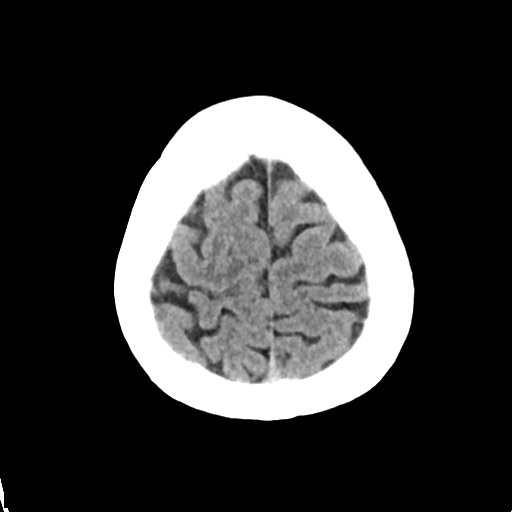
[im 28/32  brain]
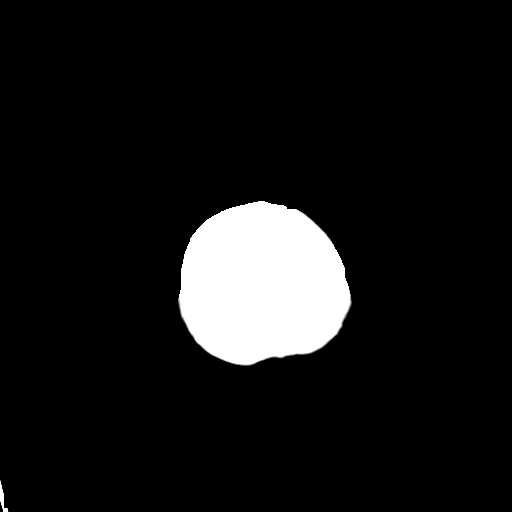

[Series 3: head bone · axial · 0.41mm/px · z∈[-98,-66]mm · 3 of 78 slices shown]
[im 8/78  bone]
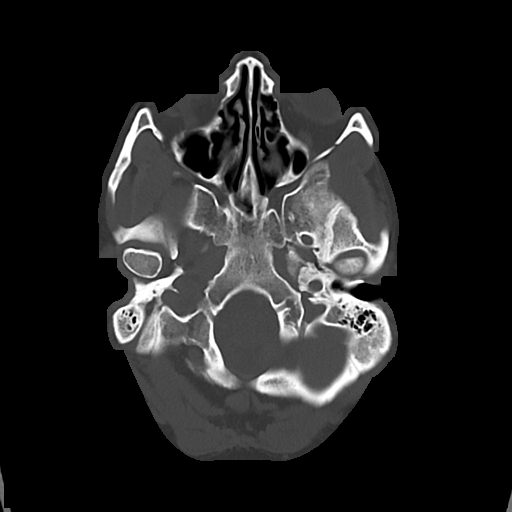
[im 16/78  bone]
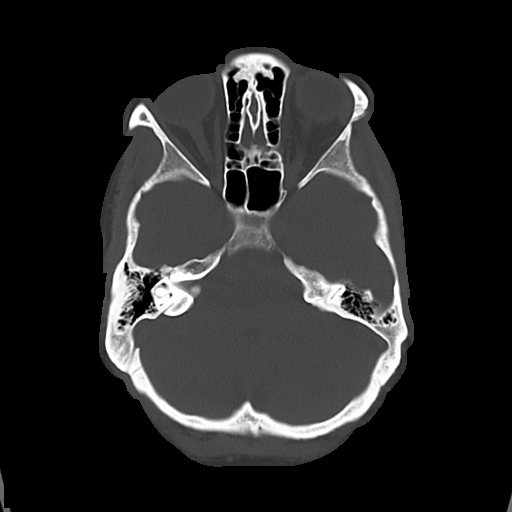
[im 24/78  bone]
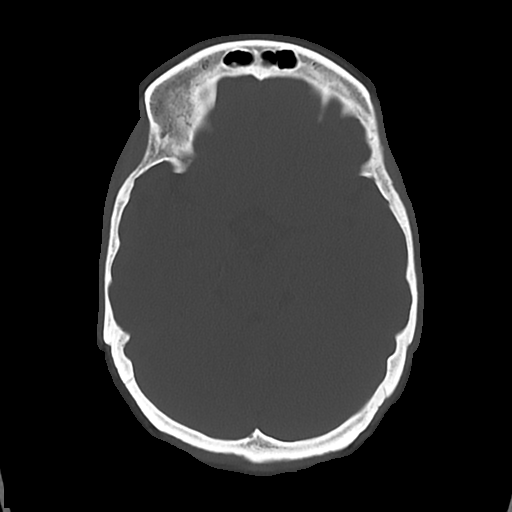

[Series 4: cor soft · coronal · 0.29mm/px · 3 of 64 slices shown]
[im 22/64  brain]
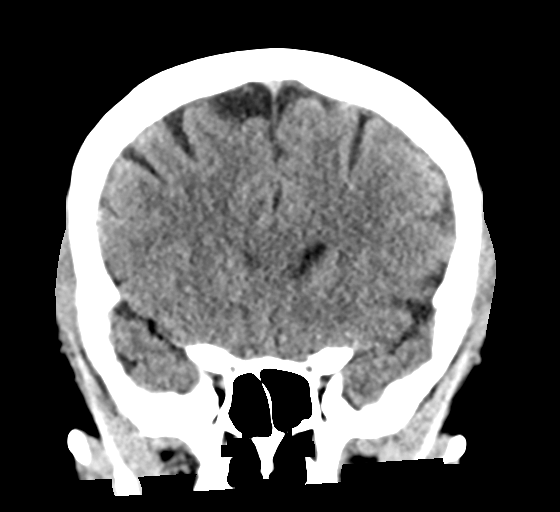
[im 29/64  brain]
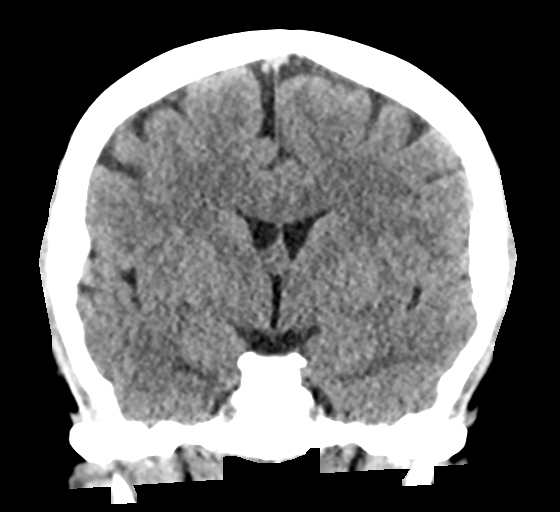
[im 36/64  brain]
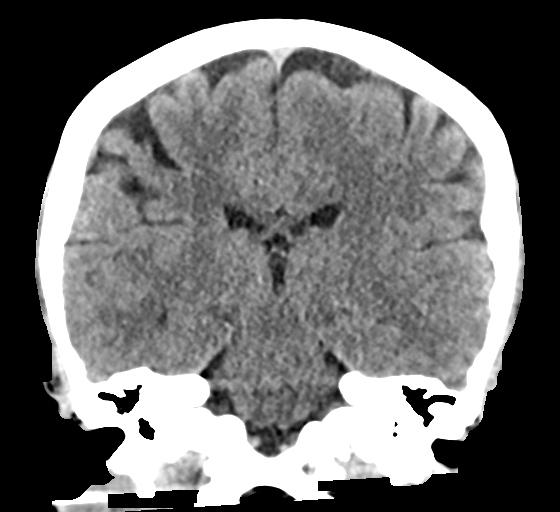

[Series 5: sag soft · sagittal · 0.29mm/px · 3 of 55 slices shown]
[im 19/55  brain]
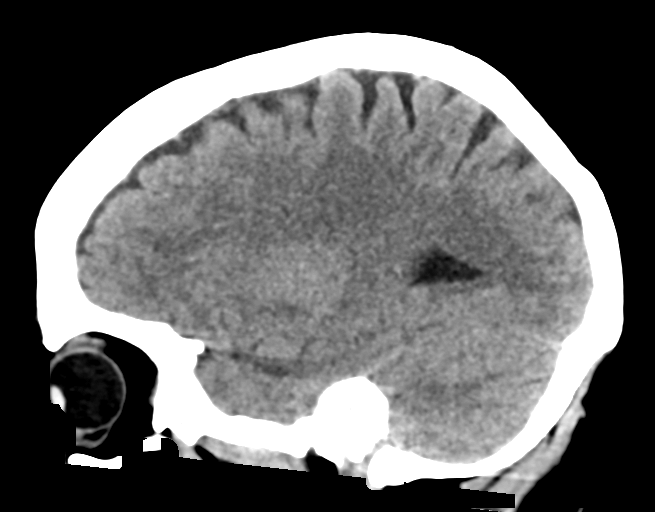
[im 28/55  brain]
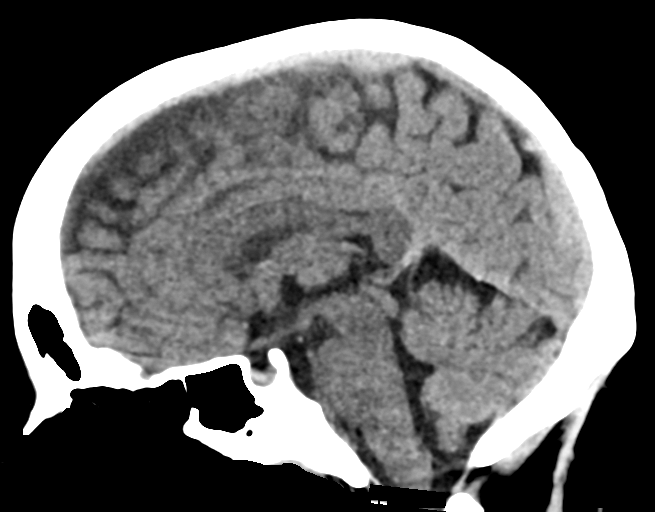
[im 37/55  brain]
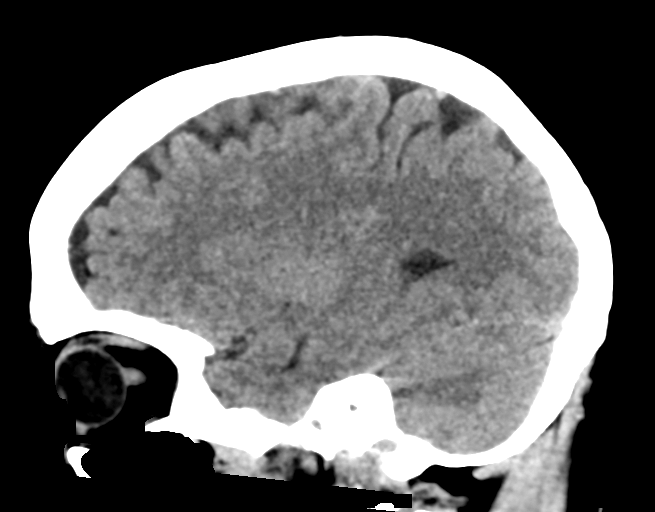

[16 of 47 positions shown; findings below may reference images not displayed]

FINDINGS: Brain: No evidence of acute infarction, hemorrhage, hydrocephalus,
extra-axial collection or mass lesion/mass effect.

Vascular: No hyperdense vessel identified.

Skull: No acute fracture.

Sinuses/Orbits: Clear sinuses.  No acute orbital findings.

Other: No mastoid effusions.
IMPRESSION: No evidence of acute intracranial abnormality.
